# Patient Record
Sex: Female | Born: 1955
Health system: Southern US, Community
[De-identification: ages and names within clinical notes are randomized; demographics above are authoritative.]

## PROBLEM LIST (undated history)

## (undated) DIAGNOSIS — K649 Unspecified hemorrhoids: Secondary | ICD-10-CM

## (undated) DIAGNOSIS — K760 Fatty (change of) liver, not elsewhere classified: Secondary | ICD-10-CM

## (undated) DIAGNOSIS — M1990 Unspecified osteoarthritis, unspecified site: Secondary | ICD-10-CM

## (undated) DIAGNOSIS — I8390 Asymptomatic varicose veins of unspecified lower extremity: Secondary | ICD-10-CM

## (undated) DIAGNOSIS — D649 Anemia, unspecified: Secondary | ICD-10-CM

## (undated) DIAGNOSIS — K8020 Calculus of gallbladder without cholecystitis without obstruction: Secondary | ICD-10-CM

## (undated) DIAGNOSIS — F419 Anxiety disorder, unspecified: Secondary | ICD-10-CM

## (undated) DIAGNOSIS — C4491 Basal cell carcinoma of skin, unspecified: Secondary | ICD-10-CM

## (undated) DIAGNOSIS — F32A Depression, unspecified: Secondary | ICD-10-CM

## (undated) DIAGNOSIS — F329 Major depressive disorder, single episode, unspecified: Secondary | ICD-10-CM

## (undated) DIAGNOSIS — I10 Essential (primary) hypertension: Secondary | ICD-10-CM

## (undated) DIAGNOSIS — I739 Peripheral vascular disease, unspecified: Secondary | ICD-10-CM

## (undated) DIAGNOSIS — K5790 Diverticulosis of intestine, part unspecified, without perforation or abscess without bleeding: Secondary | ICD-10-CM

## (undated) DIAGNOSIS — K219 Gastro-esophageal reflux disease without esophagitis: Secondary | ICD-10-CM

## (undated) DIAGNOSIS — G709 Myoneural disorder, unspecified: Secondary | ICD-10-CM

## (undated) DIAGNOSIS — N83209 Unspecified ovarian cyst, unspecified side: Secondary | ICD-10-CM

## (undated) DIAGNOSIS — Z87898 Personal history of other specified conditions: Secondary | ICD-10-CM

## (undated) HISTORY — DX: Depression, unspecified: F32.A

## (undated) HISTORY — DX: Unspecified osteoarthritis, unspecified site: M19.90

## (undated) HISTORY — DX: Essential (primary) hypertension: I10

## (undated) HISTORY — DX: Basal cell carcinoma of skin, unspecified: C44.91

## (undated) HISTORY — PX: BARIATRIC SURGERY: SHX1103

## (undated) HISTORY — DX: Calculus of gallbladder without cholecystitis without obstruction: K80.20

## (undated) HISTORY — PX: CARPAL TUNNEL RELEASE: SHX101

## (undated) HISTORY — PX: BUNIONECTOMY: SHX129

## (undated) HISTORY — DX: Fatty (change of) liver, not elsewhere classified: K76.0

## (undated) HISTORY — DX: Major depressive disorder, single episode, unspecified: F32.9

## (undated) HISTORY — DX: Unspecified ovarian cyst, unspecified side: N83.209

## (undated) HISTORY — PX: VAGINAL HYSTERECTOMY: SUR661

## (undated) HISTORY — DX: Diverticulosis of intestine, part unspecified, without perforation or abscess without bleeding: K57.90

## (undated) HISTORY — DX: Unspecified hemorrhoids: K64.9

## (undated) HISTORY — DX: Asymptomatic varicose veins of unspecified lower extremity: I83.90

## (undated) HISTORY — PX: CYSTOCELE REPAIR: SHX163

## (undated) HISTORY — PX: CHOLECYSTECTOMY: SHX55

---

## 1999-03-21 ENCOUNTER — Ambulatory Visit (HOSPITAL_BASED_OUTPATIENT_CLINIC_OR_DEPARTMENT_OTHER): Admission: RE | Admit: 1999-03-21 | Discharge: 1999-03-21 | Payer: Self-pay | Admitting: Orthopedic Surgery

## 2009-02-21 ENCOUNTER — Ambulatory Visit (HOSPITAL_COMMUNITY): Admission: RE | Admit: 2009-02-21 | Discharge: 2009-02-21 | Payer: Self-pay | Admitting: Nurse Practitioner

## 2010-02-25 ENCOUNTER — Encounter: Admission: RE | Admit: 2010-02-25 | Discharge: 2010-02-25 | Payer: Self-pay | Admitting: Nurse Practitioner

## 2010-05-02 ENCOUNTER — Ambulatory Visit (HOSPITAL_BASED_OUTPATIENT_CLINIC_OR_DEPARTMENT_OTHER): Admission: RE | Admit: 2010-05-02 | Discharge: 2010-05-02 | Payer: Self-pay | Admitting: Orthopedic Surgery

## 2010-11-22 LAB — BASIC METABOLIC PANEL
BUN: 10 mg/dL (ref 6–23)
Creatinine, Ser: 0.53 mg/dL (ref 0.4–1.2)
Glucose, Bld: 73 mg/dL (ref 70–99)
Sodium: 133 mEq/L — ABNORMAL LOW (ref 135–145)

## 2010-11-22 LAB — POCT HEMOGLOBIN-HEMACUE: Hemoglobin: 11.2 g/dL — ABNORMAL LOW (ref 12.0–15.0)

## 2011-02-04 ENCOUNTER — Other Ambulatory Visit: Payer: Self-pay | Admitting: *Deleted

## 2011-02-04 DIAGNOSIS — Z1231 Encounter for screening mammogram for malignant neoplasm of breast: Secondary | ICD-10-CM

## 2011-03-07 ENCOUNTER — Other Ambulatory Visit: Payer: Self-pay | Admitting: Nurse Practitioner

## 2011-03-07 ENCOUNTER — Ambulatory Visit
Admission: RE | Admit: 2011-03-07 | Discharge: 2011-03-07 | Disposition: A | Discharge status: Home / Self Care | Payer: Commercial Managed Care - PPO | Source: Ambulatory Visit | Attending: *Deleted | Admitting: *Deleted

## 2011-03-07 DIAGNOSIS — Z1231 Encounter for screening mammogram for malignant neoplasm of breast: Secondary | ICD-10-CM

## 2011-04-02 ENCOUNTER — Telehealth: Payer: Self-pay | Admitting: *Deleted

## 2011-04-02 NOTE — Telephone Encounter (Signed)
Pt saw Dr Timothy Lasso for rectal bleeding and he suggested she come to see [place]. Hx gastric bypass, last COLON before that, 2008 she thinks. Pt to see Dr Christella Hartigan on 04/07/11 at 2pm.

## 2011-04-07 ENCOUNTER — Ambulatory Visit (INDEPENDENT_AMBULATORY_CARE_PROVIDER_SITE_OTHER): Payer: 59 | Admitting: Gastroenterology

## 2011-04-07 ENCOUNTER — Encounter: Payer: Self-pay | Admitting: Gastroenterology

## 2011-04-07 VITALS — BP 130/82 | HR 64 | Ht 64.0 in | Wt 190.0 lb

## 2011-04-07 DIAGNOSIS — K644 Residual hemorrhoidal skin tags: Secondary | ICD-10-CM

## 2011-04-07 DIAGNOSIS — K625 Hemorrhage of anus and rectum: Secondary | ICD-10-CM

## 2011-04-07 MED ORDER — PEG-KCL-NACL-NASULF-NA ASC-C 100 G PO SOLR: 1.0000 | ORAL | Status: DC

## 2011-04-07 NOTE — Progress Notes (Signed)
HPI: This is a    Has had 3 episodes of br blood in stool.  She saw her pcp last week.  She had intermittent constipation but not always associated with the bleeding.  She had colonoscopy in 2001, 2003, and June, 2008. The only significant finding has been diverticulosis and small internal hemorrhoids.  She has never been found to have polyps or cancers.  egd 2008 was normal.  Had fob testing earlier this year with gynecologist and it was negative.   Last 100 pounds since roux g bypass, gained 20 pounds since then.  Review of systems: Pertinent positive and negative review of systems were noted in the above HPI section.  All other review of systems was otherwise negative.   Past Medical History  Diagnosis Date  . Hypertension   . Asthma   . Diverticulosis   . Ovarian cyst   . Unspecified hemorrhoids with other complication   . Non-alcoholic fatty liver disease     Past Surgical History  Procedure Date  . Cystocele repair   . Abdominal hysterectomy   . Cholecystectomy      reports that she has never smoked. She does not have any smokeless tobacco history on file. She reports that she does not drink alcohol or use illicit drugs.  family history includes Heart disease in her other and Stroke in her other.  There is no history of Colon cancer.    Current Medications, Allergies were all reviewed with the patient via [facility] electronic medical record system.    Physical Exam: Ht 5\' 4"  (1.626 m)  Wt 190 lb (86.183 kg)  BMI 32.61 kg/m2 Constitutional: generally well-appearing Psychiatric: alert and oriented x3 Eyes: extraocular movements intact Mouth: oral pharynx moist, no lesions Neck: supple no lymphadenopathy Cardiovascular: heart regular rate and rhythm Lungs: clear to auscultation bilaterally Abdomen: soft, nontender, nondistended, no obvious ascites, no peritoneal signs, normal bowel sounds Extremities: no lower extremity edema bilaterally Skin: no  lesions on visible extremities Rectal exam with female assistant in room.  Small external anal hemorrhoids also possible small shallow posterior, midline anal fissure, No tenderness at that site   Assessment and plan: 55 y.o. female with recent lower gi bleeding, likely anorectal in origin  Colonoscopy at her soonest convenience.  Should not resume annual fecal blood testing for colon cancer screening for AT LEAST 5 years.

## 2011-04-07 NOTE — Patient Instructions (Addendum)
You will be set up for a colonoscopy (already scheduled) You should not get annual fobt testing for colon cancer screening for at least five years after colonoscopy. A copy of this information will be made available to Dr. Timothy Lasso.

## 2011-04-30 ENCOUNTER — Ambulatory Visit (AMBULATORY_SURGERY_CENTER): Payer: 59 | Admitting: Gastroenterology

## 2011-04-30 ENCOUNTER — Encounter: Payer: Self-pay | Admitting: Gastroenterology

## 2011-04-30 DIAGNOSIS — K644 Residual hemorrhoidal skin tags: Secondary | ICD-10-CM

## 2011-04-30 DIAGNOSIS — K5730 Diverticulosis of large intestine without perforation or abscess without bleeding: Secondary | ICD-10-CM

## 2011-04-30 DIAGNOSIS — K625 Hemorrhage of anus and rectum: Secondary | ICD-10-CM

## 2011-04-30 MED ORDER — SODIUM CHLORIDE 0.9 % IV SOLN: 500.0000 mL | INTRAVENOUS | Status: DC

## 2011-04-30 NOTE — Progress Notes (Signed)
Per pt she drank  both bottles of the movi prep last night.  Pt's prep was not that good on examination.  MAW  Pt tolerated the colon exam well. MAW

## 2011-04-30 NOTE — Patient Instructions (Signed)
FOLLOW DISCHARGE INSTRUCTIONS (BLUE & GREEN SHEETS)   INFORMATION GIVEN ON DIVERTICULOSIS& HEMORRHOIDS.

## 2011-05-01 ENCOUNTER — Telehealth: Payer: Self-pay | Admitting: *Deleted

## 2011-05-01 NOTE — Telephone Encounter (Signed)
Message left to call if necessary. 

## 2011-08-07 ENCOUNTER — Encounter: Payer: Self-pay | Admitting: Vascular Surgery

## 2011-08-08 ENCOUNTER — Ambulatory Visit (INDEPENDENT_AMBULATORY_CARE_PROVIDER_SITE_OTHER): Payer: 59 | Admitting: Vascular Surgery

## 2011-08-08 ENCOUNTER — Ambulatory Visit (INDEPENDENT_AMBULATORY_CARE_PROVIDER_SITE_OTHER): Payer: 59 | Admitting: *Deleted

## 2011-08-08 ENCOUNTER — Encounter: Payer: Self-pay | Admitting: Vascular Surgery

## 2011-08-08 ENCOUNTER — Other Ambulatory Visit: Payer: 59

## 2011-08-08 VITALS — BP 148/84 | HR 51 | Resp 18 | Ht 64.0 in | Wt 193.0 lb

## 2011-08-08 DIAGNOSIS — I83893 Varicose veins of bilateral lower extremities with other complications: Secondary | ICD-10-CM | POA: Insufficient documentation

## 2011-08-08 DIAGNOSIS — I872 Venous insufficiency (chronic) (peripheral): Secondary | ICD-10-CM

## 2011-08-08 NOTE — Progress Notes (Signed)
[facility]  Referred by:  Gwen Pounds, MD [phone] [facility] 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  Patient notes, onset of her varicosities years ago.  They have managed with thigh high compression stockings without signficant pain until recently.  The patient has had no history of DVT, known history of varicose vein, no history of venous stasis ulcers, no history of  Lymphedema and history of skin changes in lower legs.  There is no family history of venous disorders.  The patient has used compression stockings in the past.  The patient has had two children.  She also had a varicose vein mistakenly biopsied as possible melanoma.    Past Medical History  Diagnosis Date  . Hypertension   . Asthma   . Diverticulosis   . Ovarian cyst   . Unspecified hemorrhoids with other complication   . Non-alcoholic fatty liver disease   . Arthritis   . Migraine   . Basal cell carcinoma     x 5  . Depression   . Gallstones   . DM (diabetes mellitus)     Past Surgical History  Procedure Date  . Cystocele repair   . Vaginal hysterectomy   . Cholecystectomy   . Bariatric surgery   . Bunionectomy     bilateral    History   Social History  . Marital Status: Married    Spouse Name: N/A    Number of Children: 2  . Years of Education: N/A   Occupational History  . Registered Nurse   .  Why   Social History Main Topics  . Smoking status: Never Smoker   . Smokeless tobacco: Never Used  . Alcohol Use: No  . Drug Use: No  . Sexually Active: Not on file   Other Topics Concern  . Not on file   Social History Narrative  . No narrative on file    Family History  Problem Relation Age of Onset  . Colon cancer Neg Hx   .  Heart disease Father     grandmother,grandfather,uncle  . Stroke Maternal Grandmother     PGM  . Lung cancer Father     Current Outpatient Prescriptions on File Prior to Visit  Medication Sig Dispense Refill  . acetaminophen (TYLENOL ARTHRITIS PAIN) 650 MG CR tablet Take 650 mg by mouth 2 (two) times daily.        . AMBULATORY NON FORMULARY MEDICATION Take 1 tablet by mouth 3 (three) times daily. Bariatric vitamin       . AMBULATORY NON FORMULARY MEDICATION Take 3 tablets by mouth 2 (two) times daily. Bariatric Calcium       . AMBULATORY NON FORMULARY MEDICATION Take 1 tablet by mouth daily. Bariatric Iron       . amitriptyline (ELAVIL) 50 MG tablet Take 50 mg by mouth at bedtime.        Marland Kitchen aspirin 81 MG tablet Take 81 mg by mouth daily.        . bisoprolol-hydrochlorothiazide (ZIAC) 10-6.25 MG per tablet Take 1 tablet by mouth daily.        . Cholecalciferol (VITAMIN D3) 2000 UNITS TABS Take 1 tablet by mouth daily.        . citalopram (CELEXA) 20 MG tablet Take  20 mg by mouth daily. TAKING 40MG  DAILY      . docusate sodium (COLACE) 100 MG capsule Take 100 mg by mouth 2 (two) times daily.        Marland Kitchen doxazosin (CARDURA) 1 MG tablet Take 1 mg by mouth at bedtime.        Marland Kitchen esomeprazole (NEXIUM) 40 MG capsule Take 40 mg by mouth daily before breakfast.        . estradiol (EVAMIST) 1.53 MG/SPRAY transdermal spray Place 1 spray onto the skin daily.        Marland Kitchen ibuprofen (ADVIL,MOTRIN) 100 MG tablet Take 100 mg by mouth every 6 (six) hours as needed.        . traMADol (ULTRAM) 50 MG tablet Take 100 mg by mouth 3 (three) times daily.        Marland Kitchen zolpidem (AMBIEN) 5 MG tablet Take 5 mg by mouth at bedtime as needed.        . peg 3350 powder (MOVIPREP) 100 G SOLR Take 1 kit (100 g total) by mouth as directed. See written handout  1 kit  0    Allergies  Allergen Reactions  . Dilaudid (Hydromorphone Hcl) Itching    NARCOTICS cause itching      Review of Systems (Positive items checked otherwise  negative)  General: [ ]  Weight loss, [ ]  Weight gain, [ ]   Loss of appetite, [ ]  Fever  Neurologic: [ ]  Dizziness, [ ]  Blackouts, [x]  Headaches, [ ]  Seizure  Ear/Nose/Throat: [ ]  Change in eyesight, [ ]  Change in hearing, [ ]  Nose bleeds, [ ]  Sore throat  Vascular: [x]  Pain in legs with walking, [ ]  Pain in feet while lying flat, [ ]  Non-healing ulcer, Stroke, [ ]  "Mini stroke", [ ]  Slurred speech, [ ]  Temporary blindness, [ ]  Blood clot in vein, [ ]  Phlebitis  Pulmonary: [ ]  Home oxygen, [ ]  Productive cough, [ ]  Bronchitis, [ ]  Coughing up blood,  [ ]  Asthma, [ ]  Wheezing  Musculoskeletal: [x]  Arthritis, [x]  Joint pain, [ ]  Muscle pain  Cardiac: [ ]  Chest pain, [ ]  Chest tightness/pressure, [ ]  Shortness of breath when lying flat, [ ]  Shortness of breath with exertion, [ ]  Palpitations, [ ]  Heart murmur, [ ]  Arrythmia,  [ ]  Atrial fibrillation  Hematologic: [ ]  Bleeding problems, [ ]  Clotting disorder, [ ]  Anemia  Psychiatric:  [ ]  Depression, [ ]  Anxiety, [ ]  Attention deficit disorder  Gastrointestinal:  [ ]  Black stool,[ ]   Blood in stool, [ ]  Peptic ulcer disease, [ ]  Reflux, [ ]  Hiatal hernia, [ ]  Trouble swallowing, [ ]  Diarrhea, [ ]  Constipation  Urinary:  [ ]  Kidney disease, [ ]  Burning with urination, [ ]  Frequent urination, [ ]  Difficulty urinating  Skin: [ ]  Ulcers, [ ]  Rashes   Physical Examination  Filed Vitals:   08/08/11 1213  BP: 148/84  Pulse: 51  Resp: 18  Height: 5\' 4"  (1.626 m)  Weight: 193 lb (87.544 kg)   Body mass index is 33.13 kg/(m^2).  General: A&O x 3, WDWN, obese  Head: [place]/AT  Ear/Nose/Throat: Hearing grossly intact, nares w/o erythema or drainage, oropharynx w/o Erythema/Exudate  Eyes: PERRLA, EOMI  Neck: Supple, no nuchal rigidity, no palpable LAD  Pulmonary: Sym exp, good air movt, CTAB, no rales, rhonchi, & wheezing  Cardiac: RRR, Nl S1, S2, no Murmurs, rubs or gallops  Vascular: Vessel Right Left  Radial Palpable Palpable    Brachial Palpable Palpable  Carotid Palpable, without bruit Palpable, without bruit  Aorta Non-palpable N/A  Femoral Palpable Palpable  Popliteal Non-palpable Non-palpable  PT Palpable Palpable  DP Palpable Palpable   Gastrointestinal: soft, NTND, -G/R, - HSM, - masses, - CVAT B  Musculoskeletal: M/S 5/5 throughout , Extremities without ischemic changes , extensive varicosities on anterior bilateral legs (L>>R), Left leg also has posterior varicosities, some skin changes c/w CVI  Neurologic: CN 2-12 intact , Pain and light touch intact in extremities , Motor exam as listed above  Psychiatric: Judgment intact, Mood & affect appropriate for pt's clinical situation  Dermatologic: See M/S exam for extremity exam, no rashes otherwise noted  Lymph : No Cervical, Axillary, or Inguinal lymphadenopathy   Non-Invasive Vascular Imaging  BLE Venous Insufficiency Duplex (Date: 08/08/11):   RLE: GSV incompetent, mild deep reflux  LLE: GSV and LSV incompetent, mild deep reflux  Medical Decision Making  Felicia Burke is a 55 y.o. female who presents with: symptomatic chronic venous insufficiency (C4) not responsive to chronic compression therapy.   Based on the patient's history and examination, I recommend: continued compression stocking use (by report multiple years already) and evaluation by Vein Clinic for possible endovenous ablation of left GSV +/- stab phlebectomy.    The right leg to be addressed later on.  I will arrange for the follow evaluation by either Dr. Arbie Cookey or Dr. Hart Rochester.  Thank you for allowing [place] to participate in this patient's care.  Leonides Sake, MD [facility] Office: [phone] Pager: [phone]  08/08/2011, 6:28 PM

## 2011-08-13 NOTE — Procedures (Unsigned)
LOWER EXTREMITY VENOUS REFLUX EXAM  INDICATION:  Varicose veins  EXAM:  Using color-flow imaging and pulse Doppler spectral analysis, the bilateral common femoral, femoral, popliteal, posterior tibial, great and small saphenous veins were evaluated.  There is evidence suggesting deep venous insufficiency in the bilateral lower extremity.  The bilateral saphenofemoral junction is not competent with reflux of >500 milliseconds.  The bilateral GSV is not competent with reflux of >500 milliseconds in the proximal mid segment with the caliber as described below.   The left proximal small saphenous vein demonstrates incompetency with thick walls that appear post phlebitic.  The right small saphenous vein appears normal.  GSV Diameter (used if found to be incompetent only)                                           Right    Left Proximal Greater Saphenous Vein           0.74 cm  1.0 cm Proximal-to-mid-thigh                     0.58 cm  0.5 cm Mid thigh                                 0.18 cm  0.35 cm Mid-distal thigh                          cm       cm Distal thigh                              0.20 cm  0.23 cm Knee                                      NV cm    NV cm  IMPRESSION: 1. Bilateral great saphenous vein is not competent with reflux of >500     milliseconds. 2. The bilateral great saphenous vein is not tortuous however they     feed multiple varicose branches bilaterally. 3. The deep venous system is not competent with reflux of >500     milliseconds. 4. The left small saphenous vein is not competent with reflux of >500     milliseconds and appears post phlebitic.  ___________________________________________ Fransisco Hertz, MD  LT/MEDQ  D:  08/12/2011  T:  08/12/2011  Job:  [id number]

## 2011-11-11 ENCOUNTER — Ambulatory Visit: Payer: 59 | Admitting: Vascular Surgery

## 2011-11-17 ENCOUNTER — Encounter: Payer: Self-pay | Admitting: Vascular Surgery

## 2011-11-18 ENCOUNTER — Ambulatory Visit (INDEPENDENT_AMBULATORY_CARE_PROVIDER_SITE_OTHER): Payer: 59 | Admitting: Vascular Surgery

## 2011-11-18 ENCOUNTER — Encounter: Payer: Self-pay | Admitting: Vascular Surgery

## 2011-11-18 VITALS — BP 158/75 | HR 56 | Resp 18 | Ht 64.0 in | Wt 198.5 lb

## 2011-11-18 DIAGNOSIS — I83893 Varicose veins of bilateral lower extremities with other complications: Secondary | ICD-10-CM

## 2011-11-18 NOTE — Progress Notes (Signed)
Problems with Activities of Daily Living Secondary to Leg Pain  1. 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   2. Felicia Burke states cooking and cleaning activities that require prolonged standing are very difficult for her due to leg pain.  3. Felicia Burke states that traveling distances in an automobile is difficult due to leg pain.  Rankin, Neena Rhymes  Failure of  Conservative Therapy:  1. Worn 20-30 mm Hg thigh high compression hose >3 months with no relief of symptoms.  2. Frequently elevates legs-no relief of symptoms  3. Taken Ibuprofen 600 Mg TID with no relief of symptoms.  The patient has failed conservative treatment. She continues to have pain despite treatment. His exam reveals marked varicosities over both anterior thighs. I reimaged her veins with SonoSite and this does show large saphenous veins bilaterally. He continues to have leg pain with prolonged standing. I have recommended laser ablation of her great saphenous vein and stab phlebectomy of a large tributary varicosities. I explained this is a stage treatment. She feels that the right leg is slightly worse in her left the we will proceed with this at her earliest convenience. She understands this is an outpatient procedure and a slight risk of DVT.

## 2011-12-03 ENCOUNTER — Other Ambulatory Visit: Payer: Self-pay | Admitting: *Deleted

## 2011-12-03 DIAGNOSIS — I83893 Varicose veins of bilateral lower extremities with other complications: Secondary | ICD-10-CM

## 2011-12-10 ENCOUNTER — Other Ambulatory Visit: Payer: Self-pay | Admitting: *Deleted

## 2011-12-10 DIAGNOSIS — I83893 Varicose veins of bilateral lower extremities with other complications: Secondary | ICD-10-CM

## 2012-01-21 ENCOUNTER — Encounter: Payer: Self-pay | Admitting: Vascular Surgery

## 2012-01-22 ENCOUNTER — Other Ambulatory Visit: Payer: Self-pay | Admitting: *Deleted

## 2012-01-22 ENCOUNTER — Ambulatory Visit (INDEPENDENT_AMBULATORY_CARE_PROVIDER_SITE_OTHER): Payer: 59 | Admitting: Vascular Surgery

## 2012-01-22 ENCOUNTER — Encounter: Payer: Self-pay | Admitting: Vascular Surgery

## 2012-01-22 VITALS — BP 155/92 | HR 56 | Resp 18 | Ht 64.0 in | Wt 198.8 lb

## 2012-01-22 DIAGNOSIS — I83893 Varicose veins of bilateral lower extremities with other complications: Secondary | ICD-10-CM

## 2012-01-22 HISTORY — PX: ENDOVENOUS ABLATION SAPHENOUS VEIN W/ LASER: SUR449

## 2012-01-22 NOTE — Progress Notes (Signed)
Laser Ablation Procedure      Date: 01/22/2012    Felicia Burke DOB:04-Nov-1955  Consent signed: Yes  Surgeon:T.F. Linsie Lupo  Procedure: Laser Ablation: right Greater Saphenous Vein  BP 155/92  Pulse 56  Resp 18  Ht 5\' 4"  (1.626 m)  Wt 198 lb 12.8 oz (90.175 kg)  BMI 34.12 kg/m2  Start time: 8:30AM   End time: 10:20AM  Tumescent Anesthesia: 700 cc 0.9% NaCl with 50 cc Lidocaine HCL with 1% Epi and 15 cc 8.4% NaHCO3  Local Anesthesia: 5 cc Lidocaine HCL and NaHCO3 (ratio 2:1)  Continuous Mode: 15 Watts Total Energy 1784 Joules Total Time1:58      Stab Phlebectomy >20 Sites: Thigh and Calf   Right leg  Patient tolerated procedure well: Yes  Rankin, Neena Rhymes  Description of Procedure:  After marking the course of the saphenous vein and the secondary varicosities in the standing position, the patient was placed on the operating table in the supine position, and the right leg was prepped and draped in sterile fashion. Local anesthetic was administered, and under ultrasound guidance the saphenous vein was accessed with a micro needle and guide wire; then the micro puncture sheath was placed. A guide wire was inserted to the saphenofemoral junction, followed by a 5 french sheath.  The position of the sheath and then the laser fiber below the junction was confirmed using the ultrasound and visualization of the aiming beam.  Tumescent anesthesia was administered along the course of the saphenous vein using ultrasound guidance. Protective laser glasses were placed on the patient, and the laser was fired at at 15 watt continuous mode.  For a total of 1784 joules.  A steri strip was applied to the puncture site.  The patient was then put into Trendelenburg position.  Local anesthetic was utilized overlying the marked varicosities.  Greater than >20 stab wounds were made using the tip of an 11 blade; and using the vein hook,  The phlebectomies were performed using a hemostat to avulse these  varicosities.  Adequate hemostasis was achieved, and steri strips were applied to the stab wound.      ABD pads and thigh high compression stockings were applied.  Ace wrap bandages were applied over the phlebectomy sites and at the top of the saphenofemoral junction.  Blood loss was less than 15 cc.  The patient ambulated out of the operating room having tolerated the procedure well.

## 2012-01-23 ENCOUNTER — Encounter: Payer: Self-pay | Admitting: Vascular Surgery

## 2012-01-27 ENCOUNTER — Telehealth: Payer: Self-pay | Admitting: *Deleted

## 2012-01-27 NOTE — Telephone Encounter (Signed)
01/27/2012  Time: 11:00 AM   Patient Name: Felicia Burke  Patient of: T.F. Early  Procedure:Laser Ablation right greater saphenous vein and stab phlebectomy >20 incisions right leg 01-22-2012  Reached patient at home and checked  Her status  Yes    Comments/Actions Taken    Ms. Burrous states no problems with bleeding/oozing, pain, or swelling.  Reviewed post procedure instructions with patient and reminded her of post laser duplex and follow up appointment with Dr. Arbie Cookey on 01-29-2012. Ravindra Baranek, Neena Rhymes   @SIGNATURE @

## 2012-01-28 ENCOUNTER — Encounter: Payer: Self-pay | Admitting: Vascular Surgery

## 2012-01-28 ENCOUNTER — Other Ambulatory Visit (HOSPITAL_COMMUNITY): Payer: Self-pay | Admitting: Nurse Practitioner

## 2012-01-28 DIAGNOSIS — Z1231 Encounter for screening mammogram for malignant neoplasm of breast: Secondary | ICD-10-CM

## 2012-01-29 ENCOUNTER — Ambulatory Visit (INDEPENDENT_AMBULATORY_CARE_PROVIDER_SITE_OTHER): Payer: 59 | Admitting: Vascular Surgery

## 2012-01-29 ENCOUNTER — Encounter: Payer: Self-pay | Admitting: Vascular Surgery

## 2012-01-29 VITALS — BP 158/93 | HR 49 | Resp 18 | Ht 64.0 in | Wt 200.0 lb

## 2012-01-29 DIAGNOSIS — I83893 Varicose veins of bilateral lower extremities with other complications: Secondary | ICD-10-CM

## 2012-01-29 DIAGNOSIS — M7989 Other specified soft tissue disorders: Secondary | ICD-10-CM

## 2012-01-29 DIAGNOSIS — M79609 Pain in unspecified limb: Secondary | ICD-10-CM

## 2012-01-29 NOTE — Progress Notes (Signed)
R GSV EVLA post ablation venous duplex performed @ [facility] 01/29/2012

## 2012-01-29 NOTE — Progress Notes (Signed)
The patient presents today for followup of her right leg laser ablation of great saphenous vein and stab phlebectomy of greater than 20 tributary varicosities extensively over her anterior thigh and calf. She did very well with minimal discomfort. She has the typical amount of bruising associated with the phlebectomy. He has been compliant with her compression garment  Vascular lab study reveals no evidence of DVT. She does have a bleed ablation from the insertion site in the distal thigh up to the saphenofemoral junction  Impression and plan: Successful treatment of right leg great saphenous vein reflux and tributary varicosities. She will continue to wear compression for one additional week and this leg. She is scheduled for similar treatment in her left leg in approximately 3 weeks

## 2012-02-04 NOTE — Procedures (Unsigned)
DUPLEX DEEP VENOUS EXAM - LOWER EXTREMITY  INDICATION:  Varicose veins, follow up endovenous laser ablation.  HISTORY:  Edema:  Yes. Trauma/Surgery:  Right GSV endovenous laser ablation dated 01/26/2012. Pain:  Yes. PE:  No. Previous DVT:  No. Anticoagulants:  No. Other:  DUPLEX EXAM:               CFV   SFV   PopV  PTV    GSV               R  L  R  L  R  L  R   L  R  L Thrombosis    0     0     0     0      + Spontaneous   +     +     +     +      + Phasic        +     +     +     +      0 Augmentation  +     +     +     +      0 Compressible  +     +     +     +      0 Competent     +     +     0     +  Legend:  + - yes  o - no  p - partial  D - decreased  IMPRESSION: 1. No evidence of deep vein thrombosis identified involving the right     lower extremity, patent and compressible. 2. Good post-ablation results the length of the right great saphenous     vein ablated. 3. Deep venous Reflux present involving the right popliteal vein of     >522milliseconds.      _____________________________ Larina Earthly, M.D.  SH/MEDQ  D:  01/29/2012  T:  01/29/2012  Job:  [id number]

## 2012-02-19 ENCOUNTER — Encounter: Payer: Self-pay | Admitting: Vascular Surgery

## 2012-02-25 ENCOUNTER — Encounter: Payer: Self-pay | Admitting: Vascular Surgery

## 2012-02-26 ENCOUNTER — Encounter: Payer: Self-pay | Admitting: Vascular Surgery

## 2012-02-26 ENCOUNTER — Ambulatory Visit (INDEPENDENT_AMBULATORY_CARE_PROVIDER_SITE_OTHER): Payer: 59 | Admitting: Vascular Surgery

## 2012-02-26 VITALS — BP 154/105 | HR 45 | Resp 16 | Ht 64.0 in | Wt 200.0 lb

## 2012-02-26 DIAGNOSIS — I83893 Varicose veins of bilateral lower extremities with other complications: Secondary | ICD-10-CM

## 2012-02-26 HISTORY — PX: ENDOVENOUS ABLATION SAPHENOUS VEIN W/ LASER: SUR449

## 2012-02-26 NOTE — Progress Notes (Signed)
Laser Ablation Procedure      Date: 02/26/2012    Felicia Burke DOB:[birth year]  Consent signed: Yes  Surgeon:T.F. Seraya Jobst  Procedure: Laser Ablation: left Greater Saphenous Vein  BP 154/105  Pulse 45  Resp 16  Ht 5\' 4"  (1.626 m)  Wt 200 lb (90.719 kg)  BMI 34.33 kg/m2  Start time: 8:45am   End time: 10:15am  Tumescent Anesthesia: 750 cc 0.9% NaCl with 50 cc Lidocaine HCL with 1% Epi and 15 cc 8.4% NaHCO3  Local Anesthesia: 5 cc Lidocaine HCL and NaHCO3 (ratio 2:1)  Continuous Mode: 15 WATTS Total Energy 2153 jOULES Total Time2:23     Stab Phlebectomy: >20 Sites: Thigh and Calf  Patient tolerated procedure well: Yes   Description of Procedure:  After marking the course of the saphenous vein and the secondary varicosities in the standing position, the patient was placed on the operating table in the supine position, and the left leg was prepped and draped in sterile fashion. Local anesthetic was administered, and under ultrasound guidance the saphenous vein was accessed with a micro needle and guide wire; then the micro puncture sheath was placed. A guide wire was inserted to the saphenofemoral junction, followed by a 5 french sheath.  The position of the sheath and then the laser fiber below the junction was confirmed using the ultrasound and visualization of the aiming beam.  Tumescent anesthesia was administered along the course of the saphenous vein using ultrasound guidance. Protective laser glasses were placed on the patient, and the laser was fired at at 15 watt continuous mode.  For a total of 2153 joules.  A steri strip was applied to the puncture site.  The patient was then put into Trendelenburg position.  Local anesthetic was utilized overlying the marked varicosities.  Greater than >20 stab wounds were made using the tip of an 11 blade; and using the vein hook,  The phlebectomies were performed using a hemostat to avulse these varicosities.  Adequate hemostasis was  achieved, and steri strips were applied to the stab wound.      ABD pads and thigh high compression stockings were applied.  Ace wrap bandages were applied over the phlebectomy sites and at the top of the saphenofemoral junction.  Blood loss was less than 15 cc.  The patient ambulated out of the operating room having tolerated the procedure well.

## 2012-03-01 ENCOUNTER — Telehealth: Payer: Self-pay | Admitting: *Deleted

## 2012-03-01 ENCOUNTER — Encounter: Payer: Self-pay | Admitting: Vascular Surgery

## 2012-03-01 NOTE — Telephone Encounter (Signed)
03/01/2012  Time: 1:15 PM   Patient Name: Felicia Burke  Patient of: T.F. Early  Procedure:Laser Ablation left greater saphenous vein and stab phlebectomy >20 incisions left leg 02-26-2012  Reached patient at home and checked  Her status  Yes    Comments/Actions Taken: [name] states no problems with pain, swelling, or bleeding.  Reviewed post procedural instructions with [name] and reminded her of duplex and follow up appointment with Dr. Hart Rochester on 03-02-2012. Diannah Rindfleisch, Neena Rhymes    @SIGNATURE @

## 2012-03-02 ENCOUNTER — Encounter (INDEPENDENT_AMBULATORY_CARE_PROVIDER_SITE_OTHER): Payer: 59 | Admitting: *Deleted

## 2012-03-02 ENCOUNTER — Ambulatory Visit (INDEPENDENT_AMBULATORY_CARE_PROVIDER_SITE_OTHER): Payer: 59 | Admitting: Vascular Surgery

## 2012-03-02 ENCOUNTER — Encounter: Payer: Self-pay | Admitting: Vascular Surgery

## 2012-03-02 VITALS — BP 157/90 | HR 55 | Resp 20 | Ht 64.0 in | Wt 200.0 lb

## 2012-03-02 DIAGNOSIS — I83893 Varicose veins of bilateral lower extremities with other complications: Secondary | ICD-10-CM

## 2012-03-02 NOTE — Progress Notes (Signed)
Subjective:     Patient ID: Felicia Burke, female   DOB: [birth year], 56 y.o.   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 The patient tolerated the procedure well last week and is doing nicely. She has had some mild to moderate discomfort along the course of the great saphenous vein. She already notices less edema in both legs distally. She is wearing her long leg elastic impression stockings. She took ibuprofen as instructed and is elevating the legs.  Review of Systems     Objective:   Physical ExamBP 157/90  Pulse 55  Resp 20  Ht 5\' 4"  (1.626 m)  Wt 200 lb (90.719 kg)  BMI 34.33 kg/m2  General well-developed well-nourished female in no apparent distress alert and oriented x3 Left leg with Steri-Strips in place over stab phlebectomy sites. Mild to moderate tenderness along the course of great saphenous vein. 1+ edema distally. 3+ dorsalis pedis pulse palpable left foot.  Today I ordered a venous duplex exam of the left leg which are reviewed and interpreted. There is no DVT. There is total occlusion of great saphenous vein from the distal thigh to the saphenofemoral junction     Assessment:     Successful laser ablation left great saphenous vein with multiple stab phlebectomy for painful varicosities    Plan:     Return on when necessary basis to see Dr. early

## 2012-03-05 ENCOUNTER — Telehealth: Payer: Self-pay

## 2012-03-05 NOTE — Telephone Encounter (Signed)
Phone call today to f/u with discussion of symptoms on 6/27.  Pt. Stated that her pain was "a little less".  Stated that yesterday, her pain was noticeable with sitting/ increased pressure to area of left thigh.  Stated firm area has increased in size to length of index finger.  States the bruising has lightened-up a little.  Stated she continues to wear compression hose, iced the thigh last eve, and plans on taking it easy over weekend.  Appt. Given for 03/09/12 to see Dr. Arbie Cookey.  Agrees w/ plan.  Pt. Knows to go to the ER over weekend if sx's worsen.

## 2012-03-05 NOTE — Telephone Encounter (Signed)
Pt. called on 03/04/12 to report left upper thigh pain that started on 6/26.  Describes an area approx. 1" long and the width of little finger, that feels "firm".  Stated has significant bruising.  Denied any increased swelling or warmth.  Stated it is difficult to tell if there is any redness within the bruised area.  Voiced concern of this being very different than the last time she had laser tx.  Stated everything checked out fine when seen in f/u on Tues., 6/25, and noticed this new development on 6/26.  Discussed w/ Dr. Darrick Penna.  Advised pt. can schedule appt. to see Dr. Arbie Cookey next Tuesday.  Voice message left for pt. Re: scheduling appt. To see Dr. Arbie Cookey.

## 2012-03-08 ENCOUNTER — Ambulatory Visit (HOSPITAL_COMMUNITY): Payer: 59

## 2012-03-08 ENCOUNTER — Encounter: Payer: Self-pay | Admitting: Vascular Surgery

## 2012-03-09 ENCOUNTER — Ambulatory Visit (INDEPENDENT_AMBULATORY_CARE_PROVIDER_SITE_OTHER): Payer: 59 | Admitting: Vascular Surgery

## 2012-03-09 ENCOUNTER — Encounter: Payer: Self-pay | Admitting: Vascular Surgery

## 2012-03-09 VITALS — BP 179/71 | HR 69 | Resp 18 | Ht 64.0 in | Wt 200.0 lb

## 2012-03-09 DIAGNOSIS — I83893 Varicose veins of bilateral lower extremities with other complications: Secondary | ICD-10-CM

## 2012-03-09 NOTE — Progress Notes (Signed)
The patient has today for followup of her staged bilateral laser ablation and stab phlebectomy most curvature varicosities. She 3 standard left ablation and phlebectomy and was seen back with noninvasive vascular laboratories study on 03/02/2012 showing no evidence of DVT. Several days later she developed more bruising and swelling in her medial thigh at the level of phlebectomy incisions and is seen for further evaluation of this. She did have a visit with her OB/GYN physician who started her Keflex due to the erythema.  On physical exam she does have some hematoma under the skin and area of Phlebectomy very large varicosities in her proximal medial thigh. I do not see any evidence of infection. I did reimage her saphenous vein the SonoSite and this is occluded.  Pressure and plan: Combination of superficial thrombophlebitis and hematoma with some soreness associated with this. The patient was reassured that this will resolve spontaneously. She will continue her elevation and compression garments which she reports is giving her improved symptom relief. She is able to continue her activities as needed

## 2012-03-15 NOTE — Procedures (Unsigned)
DUPLEX DEEP VENOUS EXAM - LOWER EXTREMITY  INDICATION:  Varicose veins with other complication  HISTORY:  Edema:  No Trauma/Surgery:  Right great saphenous vein laser ablation on 01/22/2012, left great saphenous vein laser ablation on 02/26/2012 Pain:  No PE:  No Previous DVT:  No Anticoagulants: Other:  DUPLEX EXAM:               CFV   SFV   PopV  PTV    GSV               R  L  R  L  R  L  R   L  R  L Thrombosis    o  o     o     o      o     + Spontaneous   +  +     +     +      +     o Phasic        +  +     +     +      +     o Augmentation  +  +     +     +      +     o Compressible  +  +     +     +      +     o Competent     +  +     +     +      +     o  Legend:  + - yes  o - no  p - partial  D - decreased  IMPRESSION: 1. No evidence of deep vein thrombosis noted in the left lower     extremity. 2. The left great saphenous vein appears totally occluded from the     distal insertion site to near the saphenofemoral junction. 3. Reflux of >500 milliseconds noted in the left saphenofemoral     junction which extends into a medial branch.   _____________________________ Quita Skye. Hart Rochester, M.D.  CH/MEDQ  D:  03/05/2012  T:  03/05/2012  Job:  [id number]

## 2012-04-27 ENCOUNTER — Ambulatory Visit (HOSPITAL_COMMUNITY): Payer: 59

## 2012-04-30 ENCOUNTER — Ambulatory Visit (HOSPITAL_COMMUNITY)
Admission: RE | Admit: 2012-04-30 | Discharge: 2012-04-30 | Disposition: A | Discharge status: Home / Self Care | Payer: 59 | Source: Ambulatory Visit | Attending: Nurse Practitioner | Admitting: Nurse Practitioner

## 2012-04-30 DIAGNOSIS — Z1231 Encounter for screening mammogram for malignant neoplasm of breast: Secondary | ICD-10-CM | POA: Insufficient documentation

## 2013-03-28 ENCOUNTER — Other Ambulatory Visit (HOSPITAL_COMMUNITY): Payer: Self-pay | Admitting: Nurse Practitioner

## 2013-03-28 DIAGNOSIS — Z1231 Encounter for screening mammogram for malignant neoplasm of breast: Secondary | ICD-10-CM

## 2013-05-02 ENCOUNTER — Ambulatory Visit (HOSPITAL_COMMUNITY)
Admission: RE | Admit: 2013-05-02 | Discharge: 2013-05-02 | Disposition: A | Discharge status: Home / Self Care | Payer: 59 | Source: Ambulatory Visit | Attending: Nurse Practitioner | Admitting: Nurse Practitioner

## 2013-05-02 DIAGNOSIS — Z1231 Encounter for screening mammogram for malignant neoplasm of breast: Secondary | ICD-10-CM | POA: Insufficient documentation

## 2013-05-04 ENCOUNTER — Other Ambulatory Visit: Payer: Self-pay | Admitting: Nurse Practitioner

## 2013-05-04 DIAGNOSIS — R928 Other abnormal and inconclusive findings on diagnostic imaging of breast: Secondary | ICD-10-CM

## 2013-05-30 ENCOUNTER — Ambulatory Visit
Admission: RE | Admit: 2013-05-30 | Discharge: 2013-05-30 | Disposition: A | Discharge status: Home / Self Care | Payer: 59 | Source: Ambulatory Visit | Attending: Nurse Practitioner | Admitting: Nurse Practitioner

## 2013-05-30 DIAGNOSIS — R928 Other abnormal and inconclusive findings on diagnostic imaging of breast: Secondary | ICD-10-CM

## 2014-05-03 ENCOUNTER — Ambulatory Visit: Payer: 59 | Admitting: Cardiology

## 2014-05-03 ENCOUNTER — Other Ambulatory Visit (HOSPITAL_COMMUNITY): Payer: Self-pay | Admitting: *Deleted

## 2014-05-03 ENCOUNTER — Encounter (HOSPITAL_COMMUNITY): Payer: Self-pay | Admitting: Pharmacy Technician

## 2014-05-03 NOTE — H&P (Signed)
Felicia Burke is an 58 y.o. female.    Chief Complaint: left shoulder pain  HPI: Pt is a 58 y.o. female complaining of left shoulder pain since an injury recently. Pain had continually increased since the beginning. X-rays in the clinic show subscap tear and rotator cuff tear. Pt has tried various conservative treatments which have failed to alleviate their symptoms, including medications. Various options are discussed with the patient. Risks, benefits and expectations were discussed with the patient. Patient understand the risks, benefits and expectations and wishes to proceed with surgery.   PCP:  [name], MD  D/C Plans:  Home   PMH: Past Medical History  Diagnosis Date  . Hypertension   . Asthma   . Diverticulosis   . Ovarian cyst   . Unspecified hemorrhoids with other complication   . Non-alcoholic fatty liver disease   . Arthritis   . Migraine   . Basal cell carcinoma     x 5  . Depression   . Gallstones   . DM (diabetes mellitus)   . Varicose veins     PSH: Past Surgical History  Procedure Laterality Date  . Cystocele repair    . Vaginal hysterectomy    . Cholecystectomy    . Bariatric surgery    . Bunionectomy      bilateral  . Endovenous ablation saphenous vein w/ laser  01-22-2012    right greater saphenous vein and stab phlebectomy >20 incisions right leg    [name]  . Endovenous ablation saphenous vein w/ laser  02-26-2012    left greater saphenous vein and stab phlebectomy >20 left leg    Social History:  reports that she has never smoked. She has never used smokeless tobacco. She reports that she does not drink alcohol or use illicit drugs.  Allergies:  Allergies  Allergen Reactions  . Dilaudid [Hydromorphone Hcl] Itching    NARCOTICS cause itching     Medications: No current facility-administered medications for this encounter.   Current Outpatient Prescriptions  Medication Sig Dispense Refill  . acetaminophen (TYLENOL  ARTHRITIS PAIN) 650 MG CR tablet Take 650 mg by mouth 2 (two) times daily.        . AMBULATORY NON FORMULARY MEDICATION Take 1 tablet by mouth 3 (three) times daily. Bariatric vitamin       . AMBULATORY NON FORMULARY MEDICATION Take 3 tablets by mouth 2 (two) times daily. Bariatric Calcium       . AMBULATORY NON FORMULARY MEDICATION Take 1 tablet by mouth daily. Bariatric Iron       . amitriptyline (ELAVIL) 50 MG tablet Take 50 mg by mouth at bedtime.        Marland Kitchen aspirin 81 MG tablet Take 81 mg by mouth daily.        . bisoprolol-hydrochlorothiazide (ZIAC) 10-6.25 MG per tablet Take 1 tablet by mouth daily.        . cephALEXin (KEFLEX) 500 MG capsule Take 500 mg by mouth 3 (three) times daily.      . Cholecalciferol (VITAMIN D3) 2000 UNITS TABS Take 1 tablet by mouth daily.        . citalopram (CELEXA) 20 MG tablet Take 40 mg by mouth daily. TAKING 40MG DAILY      . docusate sodium (COLACE) 100 MG capsule Take 100 mg by mouth 2 (two) times daily.        Marland Kitchen doxazosin (CARDURA) 1 MG tablet Take 1 mg by mouth at bedtime.        Marland Kitchen  esomeprazole (NEXIUM) 40 MG capsule Take 40 mg by mouth daily before breakfast.        . estradiol (EVAMIST) 1.53 MG/SPRAY transdermal spray Place 1 spray onto the skin daily.        Marland Kitchen ibuprofen (ADVIL,MOTRIN) 100 MG tablet Take 100 mg by mouth every 6 (six) hours as needed.        . peg 3350 powder (MOVIPREP) 100 G SOLR Take 1 kit (100 g total) by mouth as directed. See written handout  1 kit  0  . traMADol (ULTRAM) 50 MG tablet Take 100 mg by mouth 3 (three) times daily.        Marland Kitchen zolpidem (AMBIEN) 5 MG tablet Take 5 mg by mouth at bedtime as needed.          No results found for this or any previous visit (from the past 48 hour(s)). No results found.  ROS: Pain with rom of the left upper extremity  Physical Exam:  Alert and oriented 58 y.o. female in no acute distress Cranial nerves 2-12 intact Cervical spine: full rom with no tenderness, nv intact distally Chest:  active breath sounds bilaterally, no wheeze rhonchi or rales Heart: regular rate and rhythm, no murmur Abd: non tender non distended with active bowel sounds Hip is stable with rom  Left shoulder with very limited rom and strength due to recent injury nv intact distally No rashes or edema  Assessment/Plan Assessment: left shoulder pain secondary to subscap and cuff tears  Plan: Patient will undergo a left shoulder scope and cuff repair by Dr. Veverly Fells at [facility]. Risks benefits and expectations were discussed with the patient. Patient understand risks, benefits and expectations and wishes to proceed.

## 2014-05-03 NOTE — Pre-Procedure Instructions (Signed)
Felicia Burke  05/03/2014   Your procedure is scheduled on:  Monday, May 08, 2014 at 12:30 PM.   Report to [facility] Entrance "A" Admitting Office at 10:30 AM.   Call this number if you have problems the morning of surgery: [phone]   Remember:   Do not eat food or drink liquids after midnight Sunday, 05/07/14.   Take these medicines the morning of surgery with A SIP OF WATER: acetaminophen (TYLENOL ARTHRITIS PAIN), bisoprolol-hydrochlorothiazide (ZIAC), buPROPion (WELLBUTRIN XL), citalopram (CELEXA), doxazosin (CARDURA), esomeprazole (NEXIUM), gabapentin (NEURONTIN), traMADol (ULTRAM), pantoprazole (PROTONIX)   Stop Aspirin and Vitamins as of today.   Do not wear jewelry, make-up or nail polish.  Do not wear lotions, powders, or perfumes. You may wear deodorant.  Do not shave 48 hours prior to surgery.   Do not bring valuables to the hospital.  [facility] is not responsible                  for any belongings or valuables.               Contacts, dentures or bridgework may not be worn into surgery.  Leave suitcase in the car. After surgery it may be brought to your room.  For patients admitted to the hospital, discharge time is determined by your                treatment team.               Patients discharged the day of surgery will not be allowed to drive home.      Please read over the following fact sheets that you were given: Pain Booklet, Coughing and Deep Breathing and Surgical Site Infection Prevention

## 2014-05-04 ENCOUNTER — Encounter (HOSPITAL_COMMUNITY): Payer: Self-pay

## 2014-05-04 ENCOUNTER — Encounter (HOSPITAL_COMMUNITY)
Admission: RE | Admit: 2014-05-04 | Discharge: 2014-05-04 | Disposition: A | Discharge status: Home / Self Care | Payer: 59 | Source: Ambulatory Visit | Attending: Anesthesiology | Admitting: Anesthesiology

## 2014-05-04 ENCOUNTER — Encounter (HOSPITAL_COMMUNITY)
Admission: RE | Admit: 2014-05-04 | Discharge: 2014-05-04 | Disposition: A | Discharge status: Home / Self Care | Payer: 59 | Source: Ambulatory Visit | Attending: Orthopedic Surgery | Admitting: Orthopedic Surgery

## 2014-05-04 DIAGNOSIS — M24419 Recurrent dislocation, unspecified shoulder: Secondary | ICD-10-CM | POA: Diagnosis present

## 2014-05-04 DIAGNOSIS — M719 Bursopathy, unspecified: Principal | ICD-10-CM | POA: Insufficient documentation

## 2014-05-04 DIAGNOSIS — M949 Disorder of cartilage, unspecified: Secondary | ICD-10-CM | POA: Diagnosis not present

## 2014-05-04 DIAGNOSIS — M67919 Unspecified disorder of synovium and tendon, unspecified shoulder: Secondary | ICD-10-CM | POA: Insufficient documentation

## 2014-05-04 DIAGNOSIS — Z01818 Encounter for other preprocedural examination: Secondary | ICD-10-CM | POA: Insufficient documentation

## 2014-05-04 DIAGNOSIS — M899 Disorder of bone, unspecified: Secondary | ICD-10-CM | POA: Diagnosis not present

## 2014-05-04 HISTORY — DX: Anemia, unspecified: D64.9

## 2014-05-04 HISTORY — DX: Gastro-esophageal reflux disease without esophagitis: K21.9

## 2014-05-04 HISTORY — DX: Peripheral vascular disease, unspecified: I73.9

## 2014-05-04 LAB — CBC
HCT: 32.7 % — ABNORMAL LOW (ref 36.0–46.0)
Hemoglobin: 10.6 g/dL — ABNORMAL LOW (ref 12.0–15.0)
MCH: 29 pg (ref 26.0–34.0)
MCHC: 32.4 g/dL (ref 30.0–36.0)
MCV: 89.6 fL (ref 78.0–100.0)
PLATELETS: 257 10*3/uL (ref 150–400)
RBC: 3.65 MIL/uL — AB (ref 3.87–5.11)
RDW: 15.5 % (ref 11.5–15.5)
WBC: 6.6 10*3/uL (ref 4.0–10.5)

## 2014-05-04 LAB — BASIC METABOLIC PANEL
ANION GAP: 14 (ref 5–15)
BUN: 11 mg/dL (ref 6–23)
CALCIUM: 9.2 mg/dL (ref 8.4–10.5)
CO2: 26 mEq/L (ref 19–32)
Chloride: 97 mEq/L (ref 96–112)
Creatinine, Ser: 0.53 mg/dL (ref 0.50–1.10)
GFR calc Af Amer: >90 mL/min (ref >90)
GLUCOSE: 91 mg/dL (ref 70–99)
POTASSIUM: 4.1 meq/L (ref 3.7–5.3)
SODIUM: 137 meq/L (ref 137–147)

## 2014-05-07 MED ORDER — CHLORHEXIDINE GLUCONATE 4 % EX LIQD
60.0000 mL | Freq: Once | CUTANEOUS | Status: DC
  Filled 2014-05-07: qty 60

## 2014-05-07 MED ORDER — CEFAZOLIN SODIUM-DEXTROSE 2-3 GM-% IV SOLR
2.0000 g | INTRAVENOUS | Status: AC
  Administered 2014-05-08: 2 g via INTRAVENOUS
  Filled 2014-05-07: qty 50

## 2014-05-08 ENCOUNTER — Encounter (HOSPITAL_COMMUNITY): Payer: Self-pay | Admitting: Anesthesiology

## 2014-05-08 ENCOUNTER — Encounter (HOSPITAL_COMMUNITY)
Admission: RE | Disposition: A | Discharge status: Home / Self Care | Payer: Self-pay | Source: Ambulatory Visit | Attending: Orthopedic Surgery

## 2014-05-08 ENCOUNTER — Encounter (HOSPITAL_COMMUNITY): Payer: 59 | Admitting: Anesthesiology

## 2014-05-08 ENCOUNTER — Ambulatory Visit (HOSPITAL_COMMUNITY)
Admission: RE | Admit: 2014-05-08 | Discharge: 2014-05-08 | Disposition: A | Discharge status: Home / Self Care | Payer: 59 | Source: Ambulatory Visit | Attending: Orthopedic Surgery | Admitting: Orthopedic Surgery

## 2014-05-08 ENCOUNTER — Ambulatory Visit (HOSPITAL_COMMUNITY): Payer: 59 | Admitting: Anesthesiology

## 2014-05-08 DIAGNOSIS — X58XXXA Exposure to other specified factors, initial encounter: Secondary | ICD-10-CM | POA: Diagnosis not present

## 2014-05-08 DIAGNOSIS — Z79899 Other long term (current) drug therapy: Secondary | ICD-10-CM | POA: Diagnosis not present

## 2014-05-08 DIAGNOSIS — Z885 Allergy status to narcotic agent status: Secondary | ICD-10-CM | POA: Insufficient documentation

## 2014-05-08 DIAGNOSIS — Z90710 Acquired absence of both cervix and uterus: Secondary | ICD-10-CM | POA: Insufficient documentation

## 2014-05-08 DIAGNOSIS — M19019 Primary osteoarthritis, unspecified shoulder: Secondary | ICD-10-CM | POA: Insufficient documentation

## 2014-05-08 DIAGNOSIS — Z85828 Personal history of other malignant neoplasm of skin: Secondary | ICD-10-CM | POA: Diagnosis not present

## 2014-05-08 DIAGNOSIS — F329 Major depressive disorder, single episode, unspecified: Secondary | ICD-10-CM | POA: Insufficient documentation

## 2014-05-08 DIAGNOSIS — K7689 Other specified diseases of liver: Secondary | ICD-10-CM | POA: Diagnosis not present

## 2014-05-08 DIAGNOSIS — S43006A Unspecified dislocation of unspecified shoulder joint, initial encounter: Secondary | ICD-10-CM | POA: Diagnosis present

## 2014-05-08 DIAGNOSIS — E119 Type 2 diabetes mellitus without complications: Secondary | ICD-10-CM | POA: Insufficient documentation

## 2014-05-08 DIAGNOSIS — F3289 Other specified depressive episodes: Secondary | ICD-10-CM | POA: Insufficient documentation

## 2014-05-08 DIAGNOSIS — M129 Arthropathy, unspecified: Secondary | ICD-10-CM | POA: Diagnosis not present

## 2014-05-08 DIAGNOSIS — S43429A Sprain of unspecified rotator cuff capsule, initial encounter: Secondary | ICD-10-CM | POA: Insufficient documentation

## 2014-05-08 DIAGNOSIS — Z7982 Long term (current) use of aspirin: Secondary | ICD-10-CM | POA: Insufficient documentation

## 2014-05-08 DIAGNOSIS — M25819 Other specified joint disorders, unspecified shoulder: Secondary | ICD-10-CM | POA: Diagnosis not present

## 2014-05-08 DIAGNOSIS — I10 Essential (primary) hypertension: Secondary | ICD-10-CM | POA: Diagnosis not present

## 2014-05-08 DIAGNOSIS — Z9089 Acquired absence of other organs: Secondary | ICD-10-CM | POA: Insufficient documentation

## 2014-05-08 DIAGNOSIS — J45909 Unspecified asthma, uncomplicated: Secondary | ICD-10-CM | POA: Diagnosis not present

## 2014-05-08 DIAGNOSIS — M7580 Other shoulder lesions, unspecified shoulder: Secondary | ICD-10-CM

## 2014-05-08 HISTORY — PX: SHOULDER ARTHROSCOPY WITH SUBACROMIAL DECOMPRESSION, ROTATOR CUFF REPAIR AND BICEP TENDON REPAIR: SHX5687

## 2014-05-08 LAB — GLUCOSE, CAPILLARY
Glucose-Capillary: 100 mg/dL — ABNORMAL HIGH (ref 70–99)
Glucose-Capillary: 114 mg/dL — ABNORMAL HIGH (ref 70–99)

## 2014-05-08 SURGERY — SHOULDER ARTHROSCOPY WITH SUBACROMIAL DECOMPRESSION, ROTATOR CUFF REPAIR AND BICEP TENDON REPAIR
Anesthesia: General | Site: Shoulder | Laterality: Left

## 2014-05-08 MED ORDER — PROPOFOL 10 MG/ML IV BOLUS
INTRAVENOUS | Status: AC
  Filled 2014-05-08: qty 20

## 2014-05-08 MED ORDER — OXYCODONE-ACETAMINOPHEN 5-325 MG PO TABS: 1.0000 | ORAL_TABLET | ORAL | Status: DC | PRN

## 2014-05-08 MED ORDER — FENTANYL CITRATE 0.05 MG/ML IJ SOLN
INTRAMUSCULAR | Status: AC
  Filled 2014-05-08: qty 5

## 2014-05-08 MED ORDER — MIDAZOLAM HCL 2 MG/2ML IJ SOLN
INTRAMUSCULAR | Status: AC
  Administered 2014-05-08: 2 mg via INTRAVENOUS
  Filled 2014-05-08: qty 2

## 2014-05-08 MED ORDER — ROCURONIUM BROMIDE 50 MG/5ML IV SOLN
INTRAVENOUS | Status: AC
  Filled 2014-05-08: qty 1

## 2014-05-08 MED ORDER — GLYCOPYRROLATE 0.2 MG/ML IJ SOLN
INTRAMUSCULAR | Status: AC
  Filled 2014-05-08: qty 2

## 2014-05-08 MED ORDER — FENTANYL CITRATE 0.05 MG/ML IJ SOLN
50.0000 ug | Freq: Once | INTRAMUSCULAR | Status: AC
  Administered 2014-05-08: 50 ug via INTRAVENOUS

## 2014-05-08 MED ORDER — NEOSTIGMINE METHYLSULFATE 10 MG/10ML IV SOLN
INTRAVENOUS | Status: DC | PRN
  Administered 2014-05-08: 3 mg via INTRAVENOUS

## 2014-05-08 MED ORDER — DEXAMETHASONE SODIUM PHOSPHATE 4 MG/ML IJ SOLN
INTRAMUSCULAR | Status: DC | PRN
  Administered 2014-05-08: 4 mg via INTRAVENOUS

## 2014-05-08 MED ORDER — MIDAZOLAM HCL 2 MG/2ML IJ SOLN
1.0000 mg | Freq: Once | INTRAMUSCULAR | Status: AC
  Administered 2014-05-08: 2 mg via INTRAVENOUS

## 2014-05-08 MED ORDER — SODIUM CHLORIDE 0.9 % IV SOLN
10.0000 mg | INTRAVENOUS | Status: DC | PRN
  Administered 2014-05-08: 20 ug/min via INTRAVENOUS

## 2014-05-08 MED ORDER — ONDANSETRON HCL 4 MG/2ML IJ SOLN
INTRAMUSCULAR | Status: DC | PRN
  Administered 2014-05-08: 4 mg via INTRAVENOUS

## 2014-05-08 MED ORDER — BUPIVACAINE-EPINEPHRINE (PF) 0.25% -1:200000 IJ SOLN
INTRAMUSCULAR | Status: AC
  Filled 2014-05-08: qty 30

## 2014-05-08 MED ORDER — NEOSTIGMINE METHYLSULFATE 10 MG/10ML IV SOLN
INTRAVENOUS | Status: AC
  Filled 2014-05-08: qty 1

## 2014-05-08 MED ORDER — DEXAMETHASONE SODIUM PHOSPHATE 4 MG/ML IJ SOLN
INTRAMUSCULAR | Status: AC
  Filled 2014-05-08: qty 1

## 2014-05-08 MED ORDER — MIDAZOLAM HCL 2 MG/2ML IJ SOLN
INTRAMUSCULAR | Status: AC
  Filled 2014-05-08: qty 2

## 2014-05-08 MED ORDER — MIDAZOLAM HCL 5 MG/ML IJ SOLN: 2.0000 mg | Freq: Once | INTRAMUSCULAR | Status: DC

## 2014-05-08 MED ORDER — METHOCARBAMOL 500 MG PO TABS: 500.0000 mg | ORAL_TABLET | Freq: Three times a day (TID) | ORAL | Status: DC | PRN

## 2014-05-08 MED ORDER — LIDOCAINE HCL (CARDIAC) 20 MG/ML IV SOLN
INTRAVENOUS | Status: DC | PRN
  Administered 2014-05-08: 60 mg via INTRAVENOUS

## 2014-05-08 MED ORDER — BUPIVACAINE-EPINEPHRINE (PF) 0.5% -1:200000 IJ SOLN
INTRAMUSCULAR | Status: DC | PRN
  Administered 2014-05-08: 30 mL via PERINEURAL

## 2014-05-08 MED ORDER — FENTANYL CITRATE 0.05 MG/ML IJ SOLN
INTRAMUSCULAR | Status: AC
  Administered 2014-05-08: 50 ug via INTRAVENOUS
  Filled 2014-05-08: qty 2

## 2014-05-08 MED ORDER — SODIUM CHLORIDE 0.9 % IR SOLN
Status: DC | PRN
  Administered 2014-05-08 (×2): 3000 mL

## 2014-05-08 MED ORDER — LIDOCAINE HCL (CARDIAC) 20 MG/ML IV SOLN
INTRAVENOUS | Status: AC
  Filled 2014-05-08: qty 5

## 2014-05-08 MED ORDER — LACTATED RINGERS IV SOLN
INTRAVENOUS | Status: DC
  Administered 2014-05-08: 11:00:00 via INTRAVENOUS

## 2014-05-08 MED ORDER — SUCCINYLCHOLINE CHLORIDE 20 MG/ML IJ SOLN
INTRAMUSCULAR | Status: AC
  Filled 2014-05-08: qty 1

## 2014-05-08 MED ORDER — ROCURONIUM BROMIDE 100 MG/10ML IV SOLN
INTRAVENOUS | Status: DC | PRN
  Administered 2014-05-08: 50 mg via INTRAVENOUS

## 2014-05-08 MED ORDER — EPHEDRINE SULFATE 50 MG/ML IJ SOLN
INTRAMUSCULAR | Status: DC | PRN
  Administered 2014-05-08: 7.5 mg via INTRAVENOUS

## 2014-05-08 MED ORDER — BUPIVACAINE-EPINEPHRINE 0.25% -1:200000 IJ SOLN
INTRAMUSCULAR | Status: DC | PRN
  Administered 2014-05-08: 10 mL

## 2014-05-08 MED ORDER — GLYCOPYRROLATE 0.2 MG/ML IJ SOLN
INTRAMUSCULAR | Status: DC | PRN
  Administered 2014-05-08: 0.2 mg via INTRAVENOUS
  Administered 2014-05-08: 0.4 mg via INTRAVENOUS
  Administered 2014-05-08: 0.2 mg via INTRAVENOUS

## 2014-05-08 MED ORDER — PROPOFOL 10 MG/ML IV BOLUS
INTRAVENOUS | Status: DC | PRN
  Administered 2014-05-08: 150 mg via INTRAVENOUS

## 2014-05-08 SURGICAL SUPPLY — 70 items
ANCHOR ALL- SUT RC 2 SUT Y-K (Anchor) ×3 IMPLANT
ANCHOR ALL-SUT FLEX 1.3 Y-KNOT (Anchor) ×4 IMPLANT
ANCHOR ALL-SUT RC 2 SUT Y-K (Anchor) ×3 IMPLANT
BIT DRILL 1.3M DISPOSABLE (BIT) ×2 IMPLANT
BLADE LONG MED 31X9 (MISCELLANEOUS) IMPLANT
BLADE SURG 11 STRL SS (BLADE) ×2 IMPLANT
BUR OVAL 4.0 (BURR) ×2 IMPLANT
COVER SURGICAL LIGHT HANDLE (MISCELLANEOUS) ×2 IMPLANT
DRAPE INCISE IOBAN 66X45 STRL (DRAPES) ×2 IMPLANT
DRAPE STERI 35X30 U-POUCH (DRAPES) ×2 IMPLANT
DRAPE U-SHAPE 47X51 STRL (DRAPES) ×2 IMPLANT
DRSG EMULSION OIL 3X3 NADH (GAUZE/BANDAGES/DRESSINGS) ×4 IMPLANT
DRSG PAD ABDOMINAL 8X10 ST (GAUZE/BANDAGES/DRESSINGS) ×4 IMPLANT
DURAPREP 26ML APPLICATOR (WOUND CARE) ×2 IMPLANT
ELECT BLADE 4.0 EZ CLEAN MEGAD (MISCELLANEOUS) ×2
ELECT NEEDLE TIP 2.8 STRL (NEEDLE) ×2 IMPLANT
ELECT REM PT RETURN 9FT ADLT (ELECTROSURGICAL) ×2
ELECTRODE BLDE 4.0 EZ CLN MEGD (MISCELLANEOUS) ×1 IMPLANT
ELECTRODE REM PT RTRN 9FT ADLT (ELECTROSURGICAL) ×1 IMPLANT
GAUZE SPONGE 4X4 12PLY STRL (GAUZE/BANDAGES/DRESSINGS) IMPLANT
GLOVE BIO SURGEON STRL SZ 6.5 (GLOVE) ×2 IMPLANT
GLOVE BIOGEL PI IND STRL 6.5 (GLOVE) ×2 IMPLANT
GLOVE BIOGEL PI INDICATOR 6.5 (GLOVE) ×2
GLOVE BIOGEL PI ORTHO PRO 7.5 (GLOVE) ×1
GLOVE BIOGEL PI ORTHO PRO SZ8 (GLOVE) ×1
GLOVE ORTHO TXT STRL SZ7.5 (GLOVE) ×2 IMPLANT
GLOVE PI ORTHO PRO STRL 7.5 (GLOVE) ×1 IMPLANT
GLOVE PI ORTHO PRO STRL SZ8 (GLOVE) ×1 IMPLANT
GLOVE SURG ORTHO 8.5 STRL (GLOVE) ×2 IMPLANT
GOWN STRL REUS W/ TWL LRG LVL3 (GOWN DISPOSABLE) ×1 IMPLANT
GOWN STRL REUS W/ TWL XL LVL3 (GOWN DISPOSABLE) ×2 IMPLANT
GOWN STRL REUS W/TWL LRG LVL3 (GOWN DISPOSABLE) ×1
GOWN STRL REUS W/TWL XL LVL3 (GOWN DISPOSABLE) ×2
KIT BASIN OR (CUSTOM PROCEDURE TRAY) ×2 IMPLANT
KIT ROOM TURNOVER OR (KITS) ×2 IMPLANT
MANIFOLD NEPTUNE II (INSTRUMENTS) ×4 IMPLANT
NDL SUT 6 .5 CRC .975X.05 MAYO (NEEDLE) ×1 IMPLANT
NEEDLE 1/2 CIR MAYO (NEEDLE) ×2 IMPLANT
NEEDLE HYPO 25GX1X1/2 BEV (NEEDLE) ×2 IMPLANT
NEEDLE MAYO TAPER (NEEDLE) ×1
NEEDLE SPNL 18GX3.5 QUINCKE PK (NEEDLE) ×2 IMPLANT
NS IRRIG 1000ML POUR BTL (IV SOLUTION) ×2 IMPLANT
PACK SHOULDER (CUSTOM PROCEDURE TRAY) ×2 IMPLANT
PAD ARMBOARD 7.5X6 YLW CONV (MISCELLANEOUS) ×4 IMPLANT
RESECTOR FULL RADIUS 4.2MM (BLADE) ×2 IMPLANT
SET ARTHROSCOPY TUBING (MISCELLANEOUS) ×1
SET ARTHROSCOPY TUBING LN (MISCELLANEOUS) ×1 IMPLANT
SLING ARM FOAM STRAP LRG (SOFTGOODS) ×2 IMPLANT
SLING ARM MED ADULT FOAM STRAP (SOFTGOODS) IMPLANT
SPONGE GAUZE 4X4 12PLY STER LF (GAUZE/BANDAGES/DRESSINGS) ×2 IMPLANT
SPONGE LAP 4X18 X RAY DECT (DISPOSABLE) ×2 IMPLANT
STRIP CLOSURE SKIN 1/2X4 (GAUZE/BANDAGES/DRESSINGS) ×2 IMPLANT
SUCTION FRAZIER TIP 10 FR DISP (SUCTIONS) ×2 IMPLANT
SUT BONE WAX W31G (SUTURE) IMPLANT
SUT FIBERWIRE #2 38 T-5 BLUE (SUTURE) ×2
SUT MNCRL AB 4-0 PS2 18 (SUTURE) ×2 IMPLANT
SUT SILK 2 0 TIES 10X30 (SUTURE) ×2 IMPLANT
SUT VIC AB 0 CT1 27 (SUTURE) ×1
SUT VIC AB 0 CT1 27XBRD ANBCTR (SUTURE) ×1 IMPLANT
SUT VIC AB 0 CT2 27 (SUTURE) ×2 IMPLANT
SUT VIC AB 2-0 CT1 27 (SUTURE) ×1
SUT VIC AB 2-0 CT1 TAPERPNT 27 (SUTURE) ×1 IMPLANT
SUTURE FIBERWR #2 38 T-5 BLUE (SUTURE) ×1 IMPLANT
SYR CONTROL 10ML LL (SYRINGE) ×2 IMPLANT
TAPE CLOTH SURG 4X10 WHT LF (GAUZE/BANDAGES/DRESSINGS) ×2 IMPLANT
TOWEL OR 17X24 6PK STRL BLUE (TOWEL DISPOSABLE) ×2 IMPLANT
TOWEL OR 17X26 10 PK STRL BLUE (TOWEL DISPOSABLE) ×2 IMPLANT
TUBE CONNECTING 12X1/4 (SUCTIONS) ×2 IMPLANT
WAND HAND CNTRL MULTIVAC 90 (MISCELLANEOUS) ×2 IMPLANT
WATER STERILE IRR 1000ML POUR (IV SOLUTION) ×2 IMPLANT

## 2014-05-08 NOTE — Anesthesia Procedure Notes (Addendum)
Anesthesia Regional Block:  Interscalene brachial plexus block  Pre-Anesthetic Checklist: ,, timeout performed, Correct Patient, Correct Site, Correct Laterality, Correct Procedure, Correct Position, site marked, Risks and benefits discussed, pre-op evaluation,  At surgeon's request and post-op pain management  Laterality: Upper and Left  Prep: chloraprep and alcohol swabs       Needles:  Injection technique: Single-shot  Needle Type: Echogenic Needle     Needle Length: 9cm 9 cm Needle Gauge: 22 and 22 G  Needle insertion depth: 4 cm   Additional Needles:  Procedures: ultrasound guided (picture in chart) and nerve stimulator Interscalene brachial plexus block  Nerve Stimulator or Paresthesia:  Response: Twitch elicited, 0.5 mA, 0.3 ms,   Additional Responses:   Narrative:  Start time: 05/08/2014 11:35 AM End time: 05/08/2014 11:50 AM Injection made incrementally with aspirations every 5 mL.  Performed by: Personally  Anesthesiologist: Bartolo Darter, MD  Additional Notes: Block assessed prior to start of surgery.Tolreated well

## 2014-05-08 NOTE — Brief Op Note (Signed)
05/08/2014  3:32 PM  PATIENT:  Felicia Burke  58 y.o. 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, LARGE GLENOID OSSEOUS BANKART LESION   POST-OPERATIVE DIAGNOSIS:  LEFT SHOULDER ROTATOR CUFF TEAR RECURRENT DISLOCATION, LARGE GLENOID OSSEOUS BANKHART LESION   PROCEDURE:  Procedure(s): LEFT SHOULDER ARTHROSCOPY WITH SUBACROMIAL DECOMPRESSION,OPEN ROTATOR CUFF REPAIR  OPEN BICEP TENODESIS,  OPEN SUBSCAPULAR REPAIR (Left) OPEN BONY BANKART REPAIR, OPEN ROTATOR CUFF REPAIR  SURGEON:  Surgeon(s) and Role:    * [name], MD - Primary  PHYSICIAN ASSISTANT:   ASSISTANTS: Ventura Bruns, PA-C   ANESTHESIA:   regional and general  EBL:  Total I/O In: -  Out: 100 [Blood:100]  BLOOD ADMINISTERED:none  DRAINS: none   LOCAL MEDICATIONS USED:  MARCAINE     SPECIMEN:  No Specimen  DISPOSITION OF SPECIMEN:  N/A  COUNTS:  YES  TOURNIQUET:  * No tourniquets in log *  DICTATION: .Other Dictation: Dictation Number [id number]  PLAN OF CARE: Discharge to home after PACU  PATIENT DISPOSITION:  PACU - hemodynamically stable.   Delay start of Pharmacological VTE agent (>24hrs) due to surgical blood loss or risk of bleeding: not applicable

## 2014-05-08 NOTE — Anesthesia Postprocedure Evaluation (Signed)
  Anesthesia Post-op Note  Patient: Felicia Burke  Procedure(s) Performed: Procedure(s): LEFT SHOULDER ARTHROSCOPY WITH SUBACROMIAL DECOMPRESSION,OPEN ROTATOR CUFF REPAIR  OPEN BICEP TENODESIS,  OPEN SUBSCAPULAR REPAIR (Left)  Patient Location: PACU  Anesthesia Type:General and GA combined with regional for post-op pain  Level of Consciousness: awake and alert   Airway and Oxygen Therapy: Patient Spontanous Breathing  Post-op Pain: none  Post-op Assessment: Post-op Vital signs reviewed and Patient's Cardiovascular Status Stable  Post-op Vital Signs: stable  Last Vitals:  Filed Vitals:   05/08/14 1622  BP: 155/81  Pulse: 58  Temp:   Resp: 12    Complications: No apparent anesthesia complications

## 2014-05-08 NOTE — Discharge Instructions (Signed)
Ice to the shoulder at all times.  Keep a pillow propped behind the elbow to keep the arm across the waist, in front of you.  Do gentle exercises with the arm out of the sling once every hour for five minutes. Keep arm supported.  Must stay in sling if not doing exercises  Keep the incision covered and clean and dry for one week, then ok to get wet in the shower  Follow up with Dr Veverly Fells in two weeks in the office [phone]   What to eat:  For your first meals, you should eat lightly; only small meals initially.  If you do not have nausea, you may eat larger meals.  Avoid spicy, greasy and heavy food.    General Anesthesia, Adult, Care After  Refer to this sheet in the next few weeks. These instructions provide you with information on caring for yourself after your procedure. Your health care provider may also give you more specific instructions. Your treatment has been planned according to current medical practices, but problems sometimes occur. Call your health care provider if you have any problems or questions after your procedure.  WHAT TO EXPECT AFTER THE PROCEDURE  After the procedure, it is typical to experience:  Sleepiness.  Nausea and vomiting. HOME CARE INSTRUCTIONS  For the first 24 hours after general anesthesia:  Have a responsible person with you.  Do not drive a car. If you are alone, do not take public transportation.  Do not drink alcohol.  Do not take medicine that has not been prescribed by your health care provider.  Do not sign important papers or make important decisions.  You may resume a normal diet and activities as directed by your health care provider.  Change bandages (dressings) as directed.  If you have questions or problems that seem related to general anesthesia, call the hospital and ask for the anesthetist or anesthesiologist on call. SEEK MEDICAL CARE IF:  You have nausea and vomiting that continue the day after anesthesia.  You develop a  rash. SEEK IMMEDIATE MEDICAL CARE IF:  You have difficulty breathing.  You have chest pain.  You have any allergic problems. Document Released: 12/01/2000 Document Revised: 04/27/2013 Document Reviewed: 03/10/2013  [facility] Patient Information 2014 [place], [place].

## 2014-05-08 NOTE — Anesthesia Preprocedure Evaluation (Addendum)
Anesthesia Evaluation  Patient identified by MRN, date of birth, ID band Patient awake    Reviewed: Allergy & Precautions, H&P , NPO status , Patient's Chart, lab work & pertinent test results, reviewed documented beta blocker date and time   Airway       Dental   Pulmonary asthma ,  breath sounds clear to auscultation        Cardiovascular hypertension, Rhythm:Regular Rate:Normal     Neuro/Psych  Headaches,    GI/Hepatic GERD-  ,  Endo/Other  diabetes, Well Controlled, Type 2Morbid obesity  Renal/GU      Musculoskeletal   Abdominal (+) + obese,   Peds  Hematology  (+) anemia ,   Anesthesia Other Findings   Reproductive/Obstetrics                          Anesthesia Physical Anesthesia Plan  ASA: III  Anesthesia Plan: General   Post-op Pain Management:    Induction: Intravenous  Airway Management Planned: Oral ETT  Additional Equipment:   Intra-op Plan:   Post-operative Plan: Extubation in OR  Informed Consent: I have reviewed the patients History and Physical, chart, labs and discussed the procedure including the risks, benefits and alternatives for the proposed anesthesia with the patient or authorized representative who has indicated his/her understanding and acceptance.   Dental advisory given  Plan Discussed with: CRNA and Surgeon  Anesthesia Plan Comments:         Anesthesia Quick Evaluation

## 2014-05-08 NOTE — Interval H&P Note (Signed)
History and Physical Interval Note:  05/08/2014 12:08 PM  Felicia Burke  has presented today for surgery, with the diagnosis of LEFT SHOULDER ROTATOR CUFF TEAR CURRENT DISLOCATION   The various methods of treatment have been discussed with the patient and family. After consideration of risks, benefits and other options for treatment, the patient has consented to  Procedure(s): LEFT SHOULDER ARTHROSCOPY WITH SUBACROMIAL DECOMPRESSION,OPEN ROTATOR CUFF REPAIR  OPEN BICEP TENODESIS,  OPEN SUBSCAPULAR REPAIR (Left) as a surgical intervention .  The patient's history has been reviewed, patient examined, no change in status, stable for surgery.  I have reviewed the patient's chart and labs.  Questions were answered to the patient's satisfaction.     Nyeema Want,STEVEN R

## 2014-05-08 NOTE — Transfer of Care (Signed)
Immediate Anesthesia Transfer of Care Note  Patient: Felicia Burke  Procedure(s) Performed: Procedure(s): LEFT SHOULDER ARTHROSCOPY WITH SUBACROMIAL DECOMPRESSION,OPEN ROTATOR CUFF REPAIR  OPEN BICEP TENODESIS,  OPEN SUBSCAPULAR REPAIR (Left)  Patient Location: PACU  Anesthesia Type:General  Level of Consciousness: awake, alert  and oriented  Airway & Oxygen Therapy: Patient Spontanous Breathing and Patient connected to nasal cannula oxygen  Post-op Assessment: Report given to PACU RN and Post -op Vital signs reviewed and stable  Post vital signs: Reviewed and stable  Complications: No apparent anesthesia complications

## 2014-05-09 ENCOUNTER — Encounter (HOSPITAL_COMMUNITY): Payer: Self-pay | Admitting: Orthopedic Surgery

## 2014-05-09 NOTE — Op Note (Signed)
Felicia, Burke NO.:  [id number]  MEDICAL RECORD NO.:  [id number]  LOCATION:  [facility]                         FACILITY:  [facility]  PHYSICIAN:  Doran Heater. Veverly Fells, M.D. DATE OF BIRTH:  July 11, 1956  DATE OF PROCEDURE:  05/08/2014 DATE OF DISCHARGE:  05/08/2014                              OPERATIVE REPORT   PREOPERATIVE DIAGNOSIS:  Left shoulder dislocation with resulting rotator cuff tear.  Large bony Bankart lesion with redislocation.  POSTOPERATIVE DIAGNOSES:  Left shoulder dislocation with resulting rotator cuff tear.  Large bony Bankart lesion with redislocation. 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  PROCEDURE PERFORMED:  Left shoulder examined under anesthesia, shoulder arthroscopy with extensive intra-articular debridement of torn superior labrum anterior to posterior, arthroscopic biceps tenotomy, arthroscopic subacromial decompression with distal clavicle coplaning as well as open rotator cuff repair, open repair of large bony Bankart lesion, and subscapularis repair.  ATTENDING SURGEON:  Doran Heater. Veverly Fells, M.D.  ASSISTANT:  Abbott Pao. Dixon, PA-C, who was scrubbed the entire procedure and necessary for satisfactory completion of surgery.  ANESTHESIA:  General anesthesia was used plus interscalene block.  ESTIMATED BLOOD LOSS:  Minimal.  FLUID REPLACEMENT:  1200 mL crystalloids.  INSTRUMENT COUNTS:  Correct.  COMPLICATIONS:  There were no complications.  ANTIBIOTICS:  Perioperative antibiotics were given.  INDICATIONS:  The patient is a 58 year old female with worsening left shoulder pain secondary to dislocation.  She suffered a rotator cuff tear as well as a large bony Bankart lesion, partial-thickness subscapular tear, she also has advanced AC arthritis with outlet impingement for her rotator cuff.  The patient continues to report instability in her shoulder where __________ goes out, and she is in disabling amount of pain.  We  discussed options for management given the large amount of the glenoid that is involved about one-third of the width of the glenoid.  We discussed with the patient that she would likely suffer a continued instability unless we went ahead and repaired that bony defect.  We discussed that as well as a rotator cuff tear that we would attempt to repair that as well at the same time, and she did have evidence of superior labral tear and we did address that at the same time.  Risks and benefits of surgery discussed given ongoing instability and pain, she elected to proceed with surgery.  The risks and benefits were discussed.  Informed consent obtained.  DESCRIPTION OF PROCEDURE:  After adequate level of anesthesia achieved, the patient was positioned in modified beach-chair position.  Left shoulder correctly identified and examined under anesthesia.  Patient clearly had basically chronically subacutely subluxed state, she just continued to stay out.  I could reduce her back posteriorly and she would relocate but as soon as she took pressure off the front of her shoulder, she did go back out again.  We went ahead and sterilely prepped and draped the shoulder and arm.  Time-out was called.  We entered the shoulder in standard portals including anterior, posterior, and lateral portals for the arthroscope.  Identified severe tearing the superior labrum and the proximal biceps.  We released the biceps tendon arthroscopically and I debrided the damaged superior labrum.  There was terrible damage inside the shoulder including areas of full-thickness cartilage loss, both on the glenoid and the humeral head.  Numerous loose bodies were removed.  We saw the large bony defect in the front third of the glenoid.  Subscapularis did have some partial-thickness tearing, but it seemed to be largely intact.  The rotator cuff had a full-thickness supraspinatus tear.  It appeared to be basically a split- type  tear that did not appear to be significant retraction.  We reversed the scope looking posteriorly.  Posterior labrum was also damaged.  We just smoothed that down as well as there was some pretty significant delaminating of the cartilage on the posterior aspect of the humeral head, possibly near where the patient had the Hill-Sachs lesion.  We smoothed that down with a motorized shaver.  At this point, we placed the scope in the subacromial space.  We did a bursectomy and then an acromioplasty using a butcher block technique with a high-speed bur creating a type 1 acromial shape.  We did preserve the CA ligament, so to prevent any anterior or superior escape, and then we went ahead and identified the significant Allen impingement from the distal clavicle which was at least 0.5 cm below the acromion, so we went ahead and did a coplaning with the high-speed bur and got that nicely smoothed so that the rotator cuff not having any impingement on it.  Once we had done that subacromial decompression, we then made a deltopectoral incision started at the coracoid process extending down to the anterior humerus, dissection down through subcutaneous tissues using the Bovie. Identified the cephalic vein and took it initially laterally with the deltoid.  It did become injured and we had ligated during the surgery, but we retracted up to where we could see the rotator cuff tear.  We cleaned up the rotator cuff footprint with a rongeur.  We repaired with a single Y-Knot RC anchor, double loaded with #2 Hi-Fi, we also did side- to-side with #2 Hi-Fi and used #2 Hi-Fi down through bone out the lateral humerus, repaired both the medial and lateral portions of the footprint with a solid repair of that.  We then addressed the glenoid defect in the subscapular tear.  We completed the release of the subscapularis tendon which was actually in pretty good shape.  We placed #2 Hi-Fi suture in a modified  Mason-Allen suture technique in the free- end of the tendon to repair it.  We then went ahead and placed a __________ retractor, subluxed the humerus posteriorly, did some capsular release to be able to see down to the area where the inferior capsule and the large bony Bankart was located.  We were able to mobilize that entire piece of bone up with the inferior capsule back into the position.  We first repaired the bone there, scraped that and then we took the [facility].  With the [facility], scraped that bone to prepare it for healing.  We then placed 3 anchors in the bone right near the cartilage face, first was Y-knot Flex anchor, single load with #2 Hi-Fi suture and then 2 Y-knot RC anchors, both again spaced evenly along that large defect and then we took the suture __________ #2 Hi-Fi and brought that around the bony piece and sutured that into position and tied those sutures.  We had nice secure fixation. Recreated the big speed bump on the anterior part of the glenoid, felt good about just __________ a nice bumper  there and was it definitely bone-on-bone contact, so hopefully that will heal bone to bone as was anatomic as we could get it, we then repaired the subscapularis anatomically back to bone through drill holes, also with an anchor and otherwise not flexed inferiorly and then up to the interval and did rotator interval.  We did rotator interval closure with #2 Hi-Fi, we did multiple of those.  At that point, we had a nice solid repair, we examined the shoulder, we could get it about 20 degrees of external rotation, and then I was able to examine the patient under anesthesia and felt like the shoulder through a full arc of motion was stable and was not dislocating again. Then, we did the deltopectoral repair with #1 Vicryl interrupted, 2-0 Vicryl subcutaneous closure, and 4-0 Monocryl for skin and portals. Steri-Strips applied followed by sterile dressing and  shoulder sling. The patient tolerated the procedure well.     Doran Heater. Veverly Fells, M.D.     SRN/MEDQ  D:  05/08/2014  T:  05/09/2014  Job:  [id number]

## 2014-05-11 ENCOUNTER — Ambulatory Visit: Payer: 59 | Attending: Orthopedic Surgery

## 2014-05-11 DIAGNOSIS — IMO0001 Reserved for inherently not codable concepts without codable children: Secondary | ICD-10-CM | POA: Diagnosis not present

## 2014-05-11 DIAGNOSIS — M25519 Pain in unspecified shoulder: Secondary | ICD-10-CM | POA: Diagnosis not present

## 2014-05-11 DIAGNOSIS — R293 Abnormal posture: Secondary | ICD-10-CM | POA: Insufficient documentation

## 2014-05-11 DIAGNOSIS — M25619 Stiffness of unspecified shoulder, not elsewhere classified: Secondary | ICD-10-CM | POA: Diagnosis not present

## 2014-05-11 DIAGNOSIS — M6281 Muscle weakness (generalized): Secondary | ICD-10-CM | POA: Insufficient documentation

## 2014-05-23 ENCOUNTER — Ambulatory Visit: Payer: 59

## 2014-05-23 DIAGNOSIS — IMO0001 Reserved for inherently not codable concepts without codable children: Secondary | ICD-10-CM | POA: Diagnosis not present

## 2014-05-26 ENCOUNTER — Ambulatory Visit: Payer: 59

## 2014-05-26 DIAGNOSIS — IMO0001 Reserved for inherently not codable concepts without codable children: Secondary | ICD-10-CM | POA: Diagnosis not present

## 2014-05-30 ENCOUNTER — Ambulatory Visit: Payer: 59

## 2014-05-30 DIAGNOSIS — IMO0001 Reserved for inherently not codable concepts without codable children: Secondary | ICD-10-CM | POA: Diagnosis not present

## 2014-06-01 ENCOUNTER — Ambulatory Visit: Payer: 59

## 2014-06-01 DIAGNOSIS — IMO0001 Reserved for inherently not codable concepts without codable children: Secondary | ICD-10-CM | POA: Diagnosis not present

## 2014-06-05 ENCOUNTER — Ambulatory Visit: Payer: 59

## 2014-06-05 DIAGNOSIS — IMO0001 Reserved for inherently not codable concepts without codable children: Secondary | ICD-10-CM | POA: Diagnosis not present

## 2014-06-08 ENCOUNTER — Ambulatory Visit: Payer: 59 | Attending: Orthopedic Surgery

## 2014-06-08 DIAGNOSIS — M25612 Stiffness of left shoulder, not elsewhere classified: Secondary | ICD-10-CM | POA: Insufficient documentation

## 2014-06-08 DIAGNOSIS — M25512 Pain in left shoulder: Secondary | ICD-10-CM | POA: Diagnosis not present

## 2014-06-08 DIAGNOSIS — R293 Abnormal posture: Secondary | ICD-10-CM | POA: Insufficient documentation

## 2014-06-08 DIAGNOSIS — M6281 Muscle weakness (generalized): Secondary | ICD-10-CM | POA: Insufficient documentation

## 2014-06-08 DIAGNOSIS — Z5189 Encounter for other specified aftercare: Secondary | ICD-10-CM | POA: Diagnosis not present

## 2014-06-13 ENCOUNTER — Ambulatory Visit: Payer: 59

## 2014-06-13 DIAGNOSIS — Z5189 Encounter for other specified aftercare: Secondary | ICD-10-CM | POA: Diagnosis not present

## 2014-06-16 ENCOUNTER — Ambulatory Visit: Payer: 59

## 2014-06-19 ENCOUNTER — Ambulatory Visit: Payer: 59

## 2014-06-19 DIAGNOSIS — Z5189 Encounter for other specified aftercare: Secondary | ICD-10-CM | POA: Diagnosis not present

## 2014-06-22 ENCOUNTER — Ambulatory Visit: Payer: 59

## 2014-06-22 DIAGNOSIS — Z5189 Encounter for other specified aftercare: Secondary | ICD-10-CM | POA: Diagnosis not present

## 2014-06-26 ENCOUNTER — Ambulatory Visit: Payer: 59

## 2014-06-26 DIAGNOSIS — Z5189 Encounter for other specified aftercare: Secondary | ICD-10-CM | POA: Diagnosis not present

## 2014-06-29 ENCOUNTER — Ambulatory Visit: Payer: 59

## 2014-06-29 DIAGNOSIS — Z5189 Encounter for other specified aftercare: Secondary | ICD-10-CM | POA: Diagnosis not present

## 2014-07-04 ENCOUNTER — Ambulatory Visit: Payer: 59

## 2014-07-05 ENCOUNTER — Other Ambulatory Visit: Payer: Self-pay

## 2014-07-05 DIAGNOSIS — Z1231 Encounter for screening mammogram for malignant neoplasm of breast: Secondary | ICD-10-CM

## 2014-07-07 ENCOUNTER — Ambulatory Visit: Payer: 59

## 2014-07-07 DIAGNOSIS — Z5189 Encounter for other specified aftercare: Secondary | ICD-10-CM | POA: Diagnosis not present

## 2014-07-10 ENCOUNTER — Ambulatory Visit: Payer: 59 | Attending: Orthopedic Surgery

## 2014-07-10 DIAGNOSIS — R293 Abnormal posture: Secondary | ICD-10-CM | POA: Insufficient documentation

## 2014-07-10 DIAGNOSIS — M25612 Stiffness of left shoulder, not elsewhere classified: Secondary | ICD-10-CM | POA: Insufficient documentation

## 2014-07-10 DIAGNOSIS — Z5189 Encounter for other specified aftercare: Secondary | ICD-10-CM | POA: Insufficient documentation

## 2014-07-10 DIAGNOSIS — M25512 Pain in left shoulder: Secondary | ICD-10-CM | POA: Insufficient documentation

## 2014-07-10 DIAGNOSIS — M6281 Muscle weakness (generalized): Secondary | ICD-10-CM | POA: Insufficient documentation

## 2014-07-11 ENCOUNTER — Other Ambulatory Visit: Payer: Self-pay | Admitting: Obstetrics and Gynecology

## 2014-07-11 DIAGNOSIS — Z780 Asymptomatic menopausal state: Secondary | ICD-10-CM

## 2014-07-17 ENCOUNTER — Ambulatory Visit: Payer: 59

## 2014-07-17 DIAGNOSIS — R293 Abnormal posture: Secondary | ICD-10-CM | POA: Diagnosis not present

## 2014-07-17 DIAGNOSIS — M25612 Stiffness of left shoulder, not elsewhere classified: Secondary | ICD-10-CM | POA: Diagnosis not present

## 2014-07-17 DIAGNOSIS — M6281 Muscle weakness (generalized): Secondary | ICD-10-CM | POA: Diagnosis not present

## 2014-07-17 DIAGNOSIS — Z5189 Encounter for other specified aftercare: Secondary | ICD-10-CM | POA: Diagnosis present

## 2014-07-17 DIAGNOSIS — R29898 Other symptoms and signs involving the musculoskeletal system: Secondary | ICD-10-CM

## 2014-07-17 DIAGNOSIS — M25512 Pain in left shoulder: Secondary | ICD-10-CM | POA: Diagnosis not present

## 2014-07-17 NOTE — Therapy (Signed)
Physical Therapy Treatment  Patient Details  Name: Felicia Burke MRN: [id number] Date of Birth: May 26, 1956  Encounter Date: 07/17/2014      PT End of Session - 07/17/14 0756    Visit Number 16   Number of Visits 24   PT Start Time 0700   PT Stop Time 0750   PT Time Calculation (min) 50 min   Activity Tolerance Patient tolerated treatment well      Past Medical History  Diagnosis Date  . Hypertension   . Asthma   . Diverticulosis   . Ovarian cyst   . Unspecified hemorrhoids with other complication   . Non-alcoholic fatty liver disease   . Arthritis   . Migraine   . Depression   . Gallstones   . Varicose veins   . Peripheral vascular disease   . DM (diabetes mellitus)     prior to Gastric bypass  . GERD (gastroesophageal reflux disease)   . Basal cell carcinoma     x 5  . Anemia     Past Surgical History  Procedure Laterality Date  . Cystocele repair    . Vaginal hysterectomy    . Cholecystectomy    . Bariatric surgery    . Bunionectomy      bilateral Left side redo  . Endovenous ablation saphenous vein w/ laser  01-22-2012    right greater saphenous vein and stab phlebectomy >20 incisions right leg    [name]  . Endovenous ablation saphenous vein w/ laser  02-26-2012    left greater saphenous vein and stab phlebectomy >20 left leg  . Carpal tunnel release Bilateral   . Shoulder arthroscopy with subacromial decompression, rotator cuff repair and bicep tendon repair Left 05/08/2014    Procedure: LEFT SHOULDER ARTHROSCOPY WITH SUBACROMIAL DECOMPRESSION,OPEN ROTATOR CUFF REPAIR  OPEN BICEP TENODESIS,  OPEN SUBSCAPULAR REPAIR;  Surgeon: [name], MD;  Location: [place];  Service: Orthopedics;  Laterality: Left;    There were no vitals taken for this visit.  Visit Diagnosis:  Pain in joint, shoulder region, left  Stiffness of glenohumeral joint, left  Weakness of left upper extremity        [facility] PT Assessment - 07/17/14 0727    AROM   Left  Shoulder Flexion 135 Degrees   Left Shoulder ABduction --  118 degrees taken supine   Left Shoulder Internal Rotation --  60 degrees taken supine   Left Shoulder External Rotation --  40 degrees taken supine   Left Shoulder Horizontal ABduction --  115 degrees taken supine   Left Shoulder Horizontal ADduction --  105 degrees taken supine   PROM   Left Shoulder Flexion 143 Degrees   Left Shoulder ABduction 140 Degrees   Left Shoulder Internal Rotation 65 Degrees   Left Shoulder External Rotation 70 Degrees          [facility] Adult PT Treatment/Exercise - 07/17/14 0707    Exercises   Exercises --   Shoulder Exercises: Supine   Protraction Strengthening;15 reps;Weights  3 pounds with upper arm support and verbal cues to maintain    Other Supine Exercises --  triceps with upper arm support x15 3 pounds.   Shoulder Exercises: Seated   Other Seated Exercises --  presssure down into orage ball with circles RT and LT x15   Shoulder Exercises: Standing   Theraband Level (Shoulder Extension) Level 2 (Red)  with emphasis on scapular retraction and depressionn.   Other Standing Exercises --  wall ladder x15 to Level 25   Other Standing Exercises --  sitting press into orage ball with circles RT and LT. x15   Shoulder Exercises: Pulleys   Flexion 3 minutes   Manual Therapy   Manual Therapy Joint mobilization  passive range all planes to tolerance. Gr. 3 inferior glides          Education - 07/17/14 0755    Education provided No          PT Short Term Goals - 07/17/14 0759    PT SHORT TERM GOAL #1   Title Independent initial HEP   Status Achieved   PT SHORT TERM GOAL #2   Title Passive flexion to 100 degrees   Status Achieved   PT SHORT TERM GOAL #3   Title Passive abduction to 9 degrees   PT SHORT TERM GOAL #4   Status Achieved          PT Long Term Goals - 07/17/14 0801    PT LONG TERM GOAL #1   Title Demonstrrate understanding of use of cold for pain    Status Achieved   PT LONG TERM GOAL #2   Title Independent with advanced HEP   Time 8   Period Weeks   Status On-going   PT LONG TERM GOAL #3   Title Pain decreased 75% with normla activity at work and home   Time 8   Period Weeks   Status On-going   PT LONG TERM GOAL #4   Title Independent selfcare   Time 8   Period Weeks   Status Achieved   PT LONG TERM GOAL #5   Title return to light to moderate activity at home with minmal pain   Time 8   Period Weeks   Status On-going   Additional Long Term Goals   Additional Long Term Goals Yes          Plan - 07/17/14 0757    Clinical Impression Statement She had some minor soreness post but declined cold pack at end   Pt will benefit from skilled therapeutic intervention in order to improve on the following deficits Pain;Decreased range of motion;Decreased strength   Rehab Potential Good   PT Frequency 2x / week   PT Duration 8 weeks   PT Treatment/Interventions Passive range of motion;Manual techniques;Therapeutic exercise;Patient/family education   PT Next Visit Plan Progress per Dr Veverly Fells orders   PT Home Exercise Plan Progress  per MD orders   Consulted and Agree with Plan of Care Patient        Problem List Patient Active Problem List   Diagnosis Date Noted  . Varicose veins of lower extremities with other complications 16/06/9603  . Chronic venous insufficiency 08/08/2011                                            Darrel Hoover 07/17/2014, 8:07 AM

## 2014-07-17 NOTE — Patient Instructions (Signed)
Pt was asked to continue current exercise and activity adn discuss with Dr Veverly Fells later this week any limitations to activity.

## 2014-07-21 ENCOUNTER — Ambulatory Visit: Payer: 59

## 2014-07-21 DIAGNOSIS — M25612 Stiffness of left shoulder, not elsewhere classified: Secondary | ICD-10-CM

## 2014-07-21 DIAGNOSIS — M25512 Pain in left shoulder: Secondary | ICD-10-CM

## 2014-07-21 DIAGNOSIS — Z5189 Encounter for other specified aftercare: Secondary | ICD-10-CM | POA: Diagnosis not present

## 2014-07-21 DIAGNOSIS — R29898 Other symptoms and signs involving the musculoskeletal system: Secondary | ICD-10-CM

## 2014-07-21 NOTE — Patient Instructions (Signed)
Ms Dymek was advised to ice 2-3x today and tonight and tomorrow if needed. She wa sasked to not increase activity if more sore this weekend and if she did not respond negatively will add to HEP next week

## 2014-07-21 NOTE — Therapy (Signed)
Physical Therapy Treatment  Patient Details  Name: Felicia Burke MRN: [id number] Date of Birth: March 12, 1956  Encounter Date: 07/21/2014      PT End of Session - 07/21/14 0741    Visit Number 17   Number of Visits 30   PT Start Time 0658   PT Stop Time 0743   PT Time Calculation (min) 45 min   Activity Tolerance Patient tolerated treatment well   Behavior During Therapy [facility] for tasks assessed/performed      Past Medical History  Diagnosis Date  . Hypertension   . Asthma   . Diverticulosis   . Ovarian cyst   . Unspecified hemorrhoids with other complication   . Non-alcoholic fatty liver disease   . Arthritis   . Migraine   . Depression   . Gallstones   . Varicose veins   . Peripheral vascular disease   . DM (diabetes mellitus)     prior to Gastric bypass  . GERD (gastroesophageal reflux disease)   . Basal cell carcinoma     x 5  . Anemia     Past Surgical History  Procedure Laterality Date  . Cystocele repair    . Vaginal hysterectomy    . Cholecystectomy    . Bariatric surgery    . Bunionectomy      bilateral Left side redo  . Endovenous ablation saphenous vein w/ laser  01-22-2012    right greater saphenous vein and stab phlebectomy >20 incisions right leg    [name]  . Endovenous ablation saphenous vein w/ laser  02-26-2012    left greater saphenous vein and stab phlebectomy >20 left leg  . Carpal tunnel release Bilateral   . Shoulder arthroscopy with subacromial decompression, rotator cuff repair and bicep tendon repair Left 05/08/2014    Procedure: LEFT SHOULDER ARTHROSCOPY WITH SUBACROMIAL DECOMPRESSION,OPEN ROTATOR CUFF REPAIR  OPEN BICEP TENODESIS,  OPEN SUBSCAPULAR REPAIR;  Surgeon: [name], MD;  Location: [place];  Service: Orthopedics;  Laterality: Left;    There were no vitals taken for this visit.  Visit Diagnosis:  Pain in joint, shoulder region, left  Stiffness of glenohumeral joint, left  Weakness of left upper extremity      Subjective Assessment - 07/21/14 0706    Symptoms More sore this week from working  and exercise   Currently in Pain? Yes   Pain Score 4    Pain Location Shoulder   Pain Orientation Left   Pain Descriptors / Indicators Aching;Sharp   Pain Type Surgical pain   Pain Onset More than a month ago   Pain Frequency Constant   Aggravating Factors  using arm   Pain Relieving Factors cold, medication   Effect of Pain on Daily Activities limiting work and home tasks   Multiple Pain Sites No  She has some chronic pain issues in knees            [facility] Adult PT Treatment/Exercise - 07/21/14 0713    Shoulder Exercises: Seated   Theraband Level (Shoulder Extension) Level 1 (Yellow)  x15   Flexion Left;AROM;AAROM;20 reps   Abduction AROM;AAROM;20 reps  elbow at 90 degrees   Other Seated Exercises ball circles x30 clock and counter clock   Shoulder Exercises: Sidelying   External Rotation Left;15 reps;AROM  2 pounds x5  1 pound 2 x10   ABduction AROM;12 reps  1 pound   Shoulder Exercises: Standing   Protraction 12 reps;AROM  4 pounds   Horizontal ABduction AROM;12  reps  1 pound   Flexion 12 reps;Left   Shoulder Flexion Weight (lbs) 1   Other Standing Exercises wall ladder x12    Shoulder Exercises: Pulleys   Flexion --  5 minutes          PT Education - 07/21/14 0741    Education provided Yes   Education Details activity limits this weekend   Person(s) Educated Patient   Methods Explanation   Comprehension Verbalized understanding              Plan - 07/21/14 0743    Clinical Impression Statement Ms Gragert tolerated increased activity with addition of weight without increaed pain   Pt will benefit from skilled therapeutic intervention in order to improve on the following deficits Impaired flexibility;Pain;Decreased strength;Impaired UE functional use   Rehab Potential Good   PT Frequency 2x / week   PT Duration 6 weeks   PT Next Visit Plan Add to HEP if  appropriate   PT Home Exercise Plan add active exercise with LT arm   Consulted and Agree with Plan of Care Patient        Problem List Patient Active Problem List   Diagnosis Date Noted  . Varicose veins of lower extremities with other complications 54/00/8676  . Chronic venous insufficiency 08/08/2011                                              Darrel Hoover PT 07/21/2014, 7:46 AM

## 2014-07-25 ENCOUNTER — Ambulatory Visit: Payer: 59

## 2014-07-25 DIAGNOSIS — R29898 Other symptoms and signs involving the musculoskeletal system: Secondary | ICD-10-CM

## 2014-07-25 DIAGNOSIS — Z5189 Encounter for other specified aftercare: Secondary | ICD-10-CM | POA: Diagnosis not present

## 2014-07-25 DIAGNOSIS — M25612 Stiffness of left shoulder, not elsewhere classified: Secondary | ICD-10-CM

## 2014-07-25 DIAGNOSIS — M25512 Pain in left shoulder: Secondary | ICD-10-CM

## 2014-07-25 NOTE — Patient Instructions (Signed)
Strengthening: Resisted Internal Rotation   Hold tubing in left hand, elbow at side and forearm out. Rotate forearm in across body. Repeat ____ times per set. Do ____ sets per session. Do ____ sessions per day.  http://orth.exer.us/830   Copyright  VHI. All rights reserved.  Strengthening: Resisted External Rotation   Hold tubing in right hand, elbow at side and forearm across body. Rotate forearm out. Repeat ____ times per set. Do ____ sets per session. Do ____ sessions per day.  http://orth.exer.us/828   Copyright  VHI. All rights reserved.  Strengthening: Resisted Flexion   Hold tubing with left arm at side. Pull forward and up. Move shoulder through pain-free range of motion. Repeat ____ times per set. Do ____ sets per session. Do ____ sessions per day.  http://orth.exer.us/824   Copyright  VHI. All rights reserved.  Strengthening: Resisted Extension   Hold tubing in right hand, arm forward. Pull arm back, elbow straight. Repeat ____ times per set. Do ____ sets per session. Do ____ sessions per day.  http://orth.exer.us/832   Copyright  VHI. All rights reserved.   

## 2014-07-25 NOTE — Therapy (Addendum)
Physical Therapy Treatment  Patient Details  Name: Felicia Burke MRN: [id number] Date of Birth: 1955-10-10  Encounter Date: 07/25/2014      PT End of Session - 07/25/14 0747    Visit Number 18   Number of Visits 30   PT Start Time 0702   PT Stop Time 0745   PT Time Calculation (min) 43 min   Activity Tolerance Patient tolerated treatment well  She reported feeling good   Behavior During Therapy [facility] for tasks assessed/performed      Past Medical History  Diagnosis Date  . Hypertension   . Asthma   . Diverticulosis   . Ovarian cyst   . Unspecified hemorrhoids with other complication   . Non-alcoholic fatty liver disease   . Arthritis   . Migraine   . Depression   . Gallstones   . Varicose veins   . Peripheral vascular disease   . DM (diabetes mellitus)     prior to Gastric bypass  . GERD (gastroesophageal reflux disease)   . Basal cell carcinoma     x 5  . Anemia     Past Surgical History  Procedure Laterality Date  . Cystocele repair    . Vaginal hysterectomy    . Cholecystectomy    . Bariatric surgery    . Bunionectomy      bilateral Left side redo  . Endovenous ablation saphenous vein w/ laser  01-22-2012    right greater saphenous vein and stab phlebectomy >20 incisions right leg    [name]  . Endovenous ablation saphenous vein w/ laser  02-26-2012    left greater saphenous vein and stab phlebectomy >20 left leg  . Carpal tunnel release Bilateral   . Shoulder arthroscopy with subacromial decompression, rotator cuff repair and bicep tendon repair Left 05/08/2014    Procedure: LEFT SHOULDER ARTHROSCOPY WITH SUBACROMIAL DECOMPRESSION,OPEN ROTATOR CUFF REPAIR  OPEN BICEP TENODESIS,  OPEN SUBSCAPULAR REPAIR;  Surgeon: [name], MD;  Location: [place];  Service: Orthopedics;  Laterality: Left;    There were no vitals taken for this visit.  Visit Diagnosis:  Pain in joint, shoulder region, left  Stiffness of glenohumeral joint, left  Weakness of  left upper extremity      Subjective Assessment - 07/25/14 0706    Symptoms IT s doing well and not worse after last session. Difficulty with lifting into cabinets   Currently in Pain? Yes   Pain Score 3    Pain Location Shoulder   Pain Orientation Left   Pain Descriptors / Indicators Sore   Pain Type Surgical pain   Pain Frequency Constant   Multiple Pain Sites No            [facility] Adult PT Treatment/Exercise - 07/25/14 0709    Shoulder Exercises: Supine   Protraction 12 reps  2 pounds with head and shoulder lift, Cues for armalignment    Shoulder Exercises: Prone   Flexion Left;12 reps;AROM   Extension 15 reps;Left;AROM   Horizontal ABduction 1 12 reps;Left;AROM   Other Prone Exercises press downs into orange ball x15 3 sec hold   Shoulder Exercises: Sidelying   External Rotation Left;15 reps;AROM   ABduction AROM;12 reps  2pounds   Other Sidelying Exercises horizontal abduction 2 pounds x12   Shoulder Exercises: Standing   Other Standing Exercises wall ladder x12    Shoulder Exercises: Pulleys   Flexion 2 minutes     Active motion LT shoulder flexion 110  Abduction 115     Ext rotation 45   Int rotation 25                         Passive motion LT shoulder Flexion 118   Abduction 120    Ext rotation 50   Int rotation 42             PT Education - 07/25/14 0708    Education provided Yes   Education Details [place]   Person(s) Educated Patient   Methods Explanation;Demonstration;Handout   Comprehension Verbalized understanding;Returned demonstration          PT Short Term Goals - 07/25/14 0749    PT SHORT TERM GOAL #3   Title Passive abduction to 90 degrees   Status Achieved            Plan - 07/25/14 0748    Clinical Impression Statement Felicia Burke reported pleased with progress.  Continues with weakness and decreased range   Pt will benefit from skilled therapeutic intervention in order to improve on the following deficits Decreased range of  motion;Decreased strength;Impaired UE functional use   Rehab Potential Good   PT Frequency 2x / week   PT Duration 6 weeks   PT Treatment/Interventions Patient/family education;Therapeutic exercise;Manual techniques;Passive range of motion   PT Next Visit Plan Add to HEP if appropriate   PT Home Exercise Plan add active exercise with LT arm   Consulted and Agree with Plan of Care Patient        Problem List Patient Active Problem List   Diagnosis Date Noted  . Varicose veins of lower extremities with other complications 13/04/6577  . Chronic venous insufficiency 08/08/2011                                              Darrel Hoover PT 07/25/2014, 7:51 AM

## 2014-07-28 ENCOUNTER — Ambulatory Visit: Payer: 59

## 2014-07-28 DIAGNOSIS — M25512 Pain in left shoulder: Secondary | ICD-10-CM

## 2014-07-28 DIAGNOSIS — M25612 Stiffness of left shoulder, not elsewhere classified: Secondary | ICD-10-CM

## 2014-07-28 DIAGNOSIS — Z5189 Encounter for other specified aftercare: Secondary | ICD-10-CM | POA: Diagnosis not present

## 2014-07-28 DIAGNOSIS — R29898 Other symptoms and signs involving the musculoskeletal system: Secondary | ICD-10-CM

## 2014-07-28 NOTE — Patient Instructions (Signed)
Felicia Burke was asked to rest arm on pillow at work and home as able and contiue to ice daily. She was asked not to do anything strenuous with arm.

## 2014-07-28 NOTE — Therapy (Signed)
Physical Therapy Treatment  Patient Details  Name: Felicia Burke MRN: [id number] Date of Birth: [birth year]  Encounter Date: 07/28/2014      PT End of Session - 07/28/14 0742    Visit Number 19   Number of Visits 30   PT Start Time 0702   PT Stop Time 0740   PT Time Calculation (min) 38 min   Activity Tolerance Patient tolerated treatment well  She reported feeling much better post.    Behavior During Therapy [facility] for tasks assessed/performed      Past Medical History  Diagnosis Date  . Hypertension   . Asthma   . Diverticulosis   . Ovarian cyst   . Unspecified hemorrhoids with other complication   . Non-alcoholic fatty liver disease   . Arthritis   . Migraine   . Depression   . Gallstones   . Varicose veins   . Peripheral vascular disease   . DM (diabetes mellitus)     prior to Gastric bypass  . GERD (gastroesophageal reflux disease)   . Basal cell carcinoma     x 5  . Anemia     Past Surgical History  Procedure Laterality Date  . Cystocele repair    . Vaginal hysterectomy    . Cholecystectomy    . Bariatric surgery    . Bunionectomy      bilateral Left side redo  . Endovenous ablation saphenous vein w/ laser  01-22-2012    right greater saphenous vein and stab phlebectomy >20 incisions right leg    [name]  . Endovenous ablation saphenous vein w/ laser  02-26-2012    left greater saphenous vein and stab phlebectomy >20 left leg  . Carpal tunnel release Bilateral   . Shoulder arthroscopy with subacromial decompression, rotator cuff repair and bicep tendon repair Left 05/08/2014    Procedure: LEFT SHOULDER ARTHROSCOPY WITH SUBACROMIAL DECOMPRESSION,OPEN ROTATOR CUFF REPAIR  OPEN BICEP TENODESIS,  OPEN SUBSCAPULAR REPAIR;  Surgeon: [name], MD;  Location: [place];  Service: Orthopedics;  Laterality: Left;    There were no vitals taken for this visit.  Visit Diagnosis:  Pain in joint, shoulder region, left  Stiffness of glenohumeral joint,  left  Weakness of left upper extremity      Subjective Assessment - 07/28/14 0705    Symptoms More sore since last visit. Icing alot and resting more   Patient Stated Goals Return to normal activity/use of LT arm without pain.    Currently in Pain? Yes   Pain Score 6    Pain Location Shoulder   Pain Orientation Left   Pain Descriptors / Indicators Aching  constand and deep    Pain Type Surgical pain   Pain Onset More than a month ago   Pain Frequency Constant   Aggravating Factors  using arm and at rest now   Pain Relieving Factors cold, medication   Effect of Pain on Daily Activities limited work and home tasks   Multiple Pain Sites No          [facility] PT Assessment - 07/28/14 0710    AROM   Left Shoulder Flexion 132 Degrees   Left Shoulder ABduction 132 Degrees   Left Shoulder Internal Rotation --  65 degrees supine   Left Shoulder External Rotation --  45 degrees supine          [facility] Adult PT Treatment/Exercise - 07/28/14 0707    Shoulder Exercises: Standing   Other Standing Exercises wall  ladder    Shoulder Exercises: Pulleys   Flexion 3 minutes   Manual Therapy   Manual Therapy Joint mobilization   Joint Mobilization Gr 2 distraction and AP glides for pain control   Manual Therapy Massage   Manual Therapy   Massage to LT shoulder soft tissue           PT Education - 07/28/14 0742    Education provided Yes   Education Details rest and pain control   Person(s) Educated Patient   Methods Explanation   Comprehension Verbalized understanding              Plan - 07/28/14 0743    Clinical Impression Statement She was much improved post manual treatment and we need to make sure she is calmed own before resuming strengthening   Pt will benefit from skilled therapeutic intervention in order to improve on the following deficits Decreased range of motion;Decreased strength;Impaired UE functional use   Rehab Potential Good   PT Frequency 2x / week   PT  Duration 6 weeks   PT Treatment/Interventions Patient/family education;Therapeutic exercise;Manual techniques;Passive range of motion   PT Next Visit Plan Add to HEP if appropriate   PT Home Exercise Plan add active exercise with LT arm   Consulted and Agree with Plan of Care Patient        Problem List Patient Active Problem List   Diagnosis Date Noted  . Varicose veins of lower extremities with other complications 09/62/8366  . Chronic venous insufficiency 08/08/2011                                              Darrel Hoover PT 07/28/2014, 7:45 AM

## 2014-07-31 ENCOUNTER — Ambulatory Visit: Payer: 59

## 2014-07-31 DIAGNOSIS — M25512 Pain in left shoulder: Secondary | ICD-10-CM

## 2014-07-31 DIAGNOSIS — M25612 Stiffness of left shoulder, not elsewhere classified: Secondary | ICD-10-CM

## 2014-07-31 DIAGNOSIS — R29898 Other symptoms and signs involving the musculoskeletal system: Secondary | ICD-10-CM

## 2014-07-31 DIAGNOSIS — Z5189 Encounter for other specified aftercare: Secondary | ICD-10-CM | POA: Diagnosis not present

## 2014-07-31 NOTE — Patient Instructions (Signed)
She was asked to not do any lifting except glass and to rest as able with arm on pllow

## 2014-07-31 NOTE — Therapy (Signed)
Physical Therapy Treatment  Patient Details  Name: Felicia Burke MRN: [id number] Date of Birth: [birth year]  Encounter Date: 07/31/2014      PT End of Session - 07/31/14 0749    Visit Number 20   Number of Visits 30   Date for PT Re-Evaluation 08/02/14   PT Start Time 0706   PT Stop Time 0745   PT Time Calculation (min) 39 min   Activity Tolerance Patient tolerated treatment well  She reported feeling best in past week   Behavior During Therapy [facility] for tasks assessed/performed      Past Medical History  Diagnosis Date  . Hypertension   . Asthma   . Diverticulosis   . Ovarian cyst   . Unspecified hemorrhoids with other complication   . Non-alcoholic fatty liver disease   . Arthritis   . Migraine   . Depression   . Gallstones   . Varicose veins   . Peripheral vascular disease   . DM (diabetes mellitus)     prior to Gastric bypass  . GERD (gastroesophageal reflux disease)   . Basal cell carcinoma     x 5  . Anemia     Past Surgical History  Procedure Laterality Date  . Cystocele repair    . Vaginal hysterectomy    . Cholecystectomy    . Bariatric surgery    . Bunionectomy      bilateral Left side redo  . Endovenous ablation saphenous vein w/ laser  01-22-2012    right greater saphenous vein and stab phlebectomy >20 incisions right leg    [name]  . Endovenous ablation saphenous vein w/ laser  02-26-2012    left greater saphenous vein and stab phlebectomy >20 left leg  . Carpal tunnel release Bilateral   . Shoulder arthroscopy with subacromial decompression, rotator cuff repair and bicep tendon repair Left 05/08/2014    Procedure: LEFT SHOULDER ARTHROSCOPY WITH SUBACROMIAL DECOMPRESSION,OPEN ROTATOR CUFF REPAIR  OPEN BICEP TENODESIS,  OPEN SUBSCAPULAR REPAIR;  Surgeon: [name], MD;  Location: [place];  Service: Orthopedics;  Laterality: Left;    There were no vitals taken for this visit.  Visit Diagnosis:  Pain in joint, shoulder region,  left  Stiffness of glenohumeral joint, left  Weakness of left upper extremity      Subjective Assessment - 07/31/14 0708    Symptoms Better. took it easy . Used arm but for light activity.    Currently in Pain? Yes   Pain Score 3    Pain Location Shoulder   Pain Orientation Left   Pain Descriptors / Indicators Aching   Pain Type Surgical pain   Pain Onset More than a month ago   Pain Frequency Constant   Aggravating Factors  using arm   Pain Relieving Factors cold, medicaiton   Effect of Pain on Daily Activities limited at work and home   Multiple Pain Sites No            [facility] Adult PT Treatment/Exercise - 07/31/14 0710    Shoulder Exercises: Standing   Other Standing Exercises wall ladder to comfort .27 rung   Shoulder Exercises: Pulleys   Flexion --  4 minutes   Ultrasound   Ultrasound Location LT shoulder   Ultrasound Parameters 100% 1MHZ  1.5 Wcm2   Ultrasound Goals Pain   Manual Therapy   Manual Therapy Joint mobilization   Joint Mobilization Gr 2-3 distraction and AP, PA glides   Manual Therapy Massage  STW  to LT shoulder                Plan - 07/31/14 0750    Clinical Impression Statement Will see how she is doing and resume some isometrics and light exercise   Pt will benefit from skilled therapeutic intervention in order to improve on the following deficits Decreased range of motion;Decreased strength;Impaired UE functional use   Rehab Potential Good   PT Frequency 2x / week   PT Duration --  5 weeks   PT Treatment/Interventions Patient/family education;Therapeutic exercise;Manual techniques;Passive range of motion   PT Next Visit Plan Add to HEP if appropriate   PT Home Exercise Plan add active exercise with LT arm   Consulted and Agree with Plan of Care Patient        Problem List Patient Active Problem List   Diagnosis Date Noted  . Varicose veins of lower extremities with other complications 19/16/6060  . Chronic venous  insufficiency 08/08/2011                                              Darrel Hoover PT 07/31/2014, 7:54 AM

## 2014-08-02 ENCOUNTER — Ambulatory Visit: Payer: 59

## 2014-08-02 DIAGNOSIS — Z5189 Encounter for other specified aftercare: Secondary | ICD-10-CM | POA: Diagnosis not present

## 2014-08-02 DIAGNOSIS — M25512 Pain in left shoulder: Secondary | ICD-10-CM

## 2014-08-02 DIAGNOSIS — R29898 Other symptoms and signs involving the musculoskeletal system: Secondary | ICD-10-CM

## 2014-08-02 DIAGNOSIS — M25612 Stiffness of left shoulder, not elsewhere classified: Secondary | ICD-10-CM

## 2014-08-02 NOTE — Therapy (Addendum)
Physical Therapy Treatment  Patient Details  Name: Felicia Burke MRN: [id number] Date of Birth: May 02, 1956  Encounter Date: 08/02/2014      PT End of Session - 08/02/14 0746    Visit Number 21   Date for PT Re-Evaluation 09/01/14   PT Start Time 0700   PT Stop Time 0742   PT Time Calculation (min) 42 min   Activity Tolerance Patient tolerated treatment well  Feels great   Behavior During Therapy [facility] for tasks assessed/performed      Past Medical History  Diagnosis Date  . Hypertension   . Asthma   . Diverticulosis   . Ovarian cyst   . Unspecified hemorrhoids with other complication   . Non-alcoholic fatty liver disease   . Arthritis   . Migraine   . Depression   . Gallstones   . Varicose veins   . Peripheral vascular disease   . DM (diabetes mellitus)     prior to Gastric bypass  . GERD (gastroesophageal reflux disease)   . Basal cell carcinoma     x 5  . Anemia     Past Surgical History  Procedure Laterality Date  . Cystocele repair    . Vaginal hysterectomy    . Cholecystectomy    . Bariatric surgery    . Bunionectomy      bilateral Left side redo  . Endovenous ablation saphenous vein w/ laser  01-22-2012    right greater saphenous vein and stab phlebectomy >20 incisions right leg    [name]  . Endovenous ablation saphenous vein w/ laser  02-26-2012    left greater saphenous vein and stab phlebectomy >20 left leg  . Carpal tunnel release Bilateral   . Shoulder arthroscopy with subacromial decompression, rotator cuff repair and bicep tendon repair Left 05/08/2014    Procedure: LEFT SHOULDER ARTHROSCOPY WITH SUBACROMIAL DECOMPRESSION,OPEN ROTATOR CUFF REPAIR  OPEN BICEP TENODESIS,  OPEN SUBSCAPULAR REPAIR;  Surgeon: [name], MD;  Location: [place];  Service: Orthopedics;  Laterality: Left;    There were no vitals taken for this visit.  Visit Diagnosis:  Pain in joint, shoulder region, left  Stiffness of glenohumeral joint, left  Weakness  of left upper extremity      Subjective Assessment - 08/02/14 0701    Symptoms Shoulder better and still has some sharp pain in middle to anterior deltoid with overhead reaching   Patient Stated Goals Return to normal activity/use of LT arm without pain.    Currently in Pain? Yes   Pain Score 0-No pain  None at rest and 3/10 with arm overhead   Pain Location Shoulder   Pain Orientation Left   Pain Type Surgical pain   Pain Onset More than a month ago   Pain Frequency Intermittent   Aggravating Factors  reaching over head   Pain Relieving Factors meds ,cold ,rest   Effect of Pain on Daily Activities limited at home and work   Multiple Pain Sites No            [facility] Adult PT Treatment/Exercise - 08/02/14 0705    Shoulder Exercises: Standing   Other Standing Exercises wall ladder x 6 with medium stretch   Shoulder Exercises: Pulleys   Flexion 3 minutes   Ultrasound   Ultrasound Location LT shoulder   Ultrasound Parameters 100% i MHz 1.6 Wcm2   Ultrasound Goals Pain   Manual Therapy   Manual Therapy Massage;Myofascial release   Shoulder Exercises: Isometric Strengthening  Flexion 5X10"   Extension 5X10"   External Rotation 5X10"   Internal Rotation 5X10"   ABduction 5X10"   ADduction 5X10"              PT Long Term Goals - 08/02/14 0748    PT LONG TERM GOAL #1   Title Demonstrrate understanding of use of cold for pain   Status Achieved   PT LONG TERM GOAL #2   Title Independent with advanced HEP   Baseline Pain ha slowed progress with Exercise   Time 4   Period Weeks   Status On-going   PT LONG TERM GOAL #3   Title Pain decreased 75% with normla activity at work and home   Baseline She is much improved with minimal exercise but we will be cautious with pushing srengthening   Time 4   Status On-going   PT LONG TERM GOAL #4   Title Independent selfcare   Status Achieved   PT LONG TERM GOAL #5   Title return to light to moderate activity at home with  minmal pain   Baseline She is doing light activity but not moderate activity   Time 4   Status On-going          Plan - 08/02/14 0747    Clinical Impression Statement Much improve with decreased pain. She tolerated isometrics so will continue and progress strength as able. No goals met as she /we had to pull back a little due to pain.    Pt will benefit from skilled therapeutic intervention in order to improve on the following deficits Decreased range of motion;Decreased strength;Impaired UE functional use   Rehab Potential Good   PT Frequency 4x / week   PT Treatment/Interventions Patient/family education;Therapeutic exercise;Manual techniques;Passive range of motion   PT Next Visit Plan Add to HEP if appropriate   PT Home Exercise Plan add active exercise with LT arm  if pain continued improved   Consulted and Agree with Plan of Care Patient        Problem List Patient Active Problem List   Diagnosis Date Noted  . Varicose veins of lower extremities with other complications 76/19/5093  . Chronic venous insufficiency 08/08/2011     FOT Score improved for visit 20 to 37% limitation from 69%                                         Darrel Hoover PT 08/02/2014, 7:51 AM

## 2014-08-02 NOTE — Therapy (Signed)
Physical Therapy Treatment  Patient Details  Name: Felicia Burke MRN: [id number] Date of Birth: [birth year]  Encounter Date: 08/02/2014    Past Medical History  Diagnosis Date  . Hypertension   . Asthma   . Diverticulosis   . Ovarian cyst   . Unspecified hemorrhoids with other complication   . Non-alcoholic fatty liver disease   . Arthritis   . Migraine   . Depression   . Gallstones   . Varicose veins   . Peripheral vascular disease   . DM (diabetes mellitus)     prior to Gastric bypass  . GERD (gastroesophageal reflux disease)   . Basal cell carcinoma     x 5  . Anemia     Past Surgical History  Procedure Laterality Date  . Cystocele repair    . Vaginal hysterectomy    . Cholecystectomy    . Bariatric surgery    . Bunionectomy      bilateral Left side redo  . Endovenous ablation saphenous vein w/ laser  01-22-2012    right greater saphenous vein and stab phlebectomy >20 incisions right leg    [name]  . Endovenous ablation saphenous vein w/ laser  02-26-2012    left greater saphenous vein and stab phlebectomy >20 left leg  . Carpal tunnel release Bilateral   . Shoulder arthroscopy with subacromial decompression, rotator cuff repair and bicep tendon repair Left 05/08/2014    Procedure: LEFT SHOULDER ARTHROSCOPY WITH SUBACROMIAL DECOMPRESSION,OPEN ROTATOR CUFF REPAIR  OPEN BICEP TENODESIS,  OPEN SUBSCAPULAR REPAIR;  Surgeon: [name], MD;  Location: [place];  Service: Orthopedics;  Laterality: Left;    There were no vitals taken for this visit.  Visit Diagnosis:  Pain in joint, shoulder region, left  Stiffness of glenohumeral joint, left  Weakness of left upper extremity      Subjective Assessment - 08/02/14 0701    Symptoms Shoulder better and still has some sharp pain in middle to anterior deltoid with overhead reaching   Patient Stated Goals Return to normal activity/use of LT arm without pain.    Currently in Pain? Yes   Pain Score 0-No pain   None at rest and 3/10 with arm overhead   Pain Location Shoulder   Pain Orientation Left   Pain Type Surgical pain   Pain Onset More than a month ago   Pain Frequency Intermittent   Aggravating Factors  reaching over head   Pain Relieving Factors meds ,cold ,rest   Effect of Pain on Daily Activities limited at home and work   Multiple Pain Sites No            [facility] Adult PT Treatment/Exercise - 08/02/14 0705    Shoulder Exercises: Standing   Other Standing Exercises wall ladder x 6 with medium stretch   Shoulder Exercises: Pulleys   Flexion 3 minutes   Ultrasound   Ultrasound Location LT shoulder   Ultrasound Parameters 100% i MHz 1.6 Wcm2   Ultrasound Goals Pain   Manual Therapy   Manual Therapy Massage;Myofascial release   Shoulder Exercises: Isometric Strengthening   Flexion 5X10"   Extension 5X10"   External Rotation 5X10"   Internal Rotation 5X10"   ABduction 5X10"   ADduction 5X10"                Problem List Patient Active Problem List   Diagnosis Date Noted  . Varicose veins of lower extremities with other complications 62/22/9798  . Chronic venous insufficiency  08/08/2011                                              Darrel Hoover PT 08/02/2014, 7:44 AM

## 2014-08-07 ENCOUNTER — Ambulatory Visit: Payer: 59

## 2014-08-07 DIAGNOSIS — M25512 Pain in left shoulder: Secondary | ICD-10-CM

## 2014-08-07 DIAGNOSIS — M25612 Stiffness of left shoulder, not elsewhere classified: Secondary | ICD-10-CM

## 2014-08-07 DIAGNOSIS — Z5189 Encounter for other specified aftercare: Secondary | ICD-10-CM | POA: Diagnosis not present

## 2014-08-07 DIAGNOSIS — R29898 Other symptoms and signs involving the musculoskeletal system: Secondary | ICD-10-CM

## 2014-08-07 NOTE — Patient Instructions (Signed)
She will continue isometrics and gentle stretching to LT shoulder

## 2014-08-07 NOTE — Therapy (Signed)
Physical Therapy Treatment  Patient Details  Name: Felicia Burke MRN: [id number] Date of Birth: 10-17-1955  Encounter Date: 08/07/2014      PT End of Session - 08/07/14 0752    Visit Number 22   Number of Visits 30   Date for PT Re-Evaluation 09/01/14   PT Start Time 0658   PT Stop Time 0752   PT Time Calculation (min) 54 min   Activity Tolerance Patient tolerated treatment well   Behavior During Therapy [facility] for tasks assessed/performed      Past Medical History  Diagnosis Date  . Hypertension   . Asthma   . Diverticulosis   . Ovarian cyst   . Unspecified hemorrhoids with other complication   . Non-alcoholic fatty liver disease   . Arthritis   . Migraine   . Depression   . Gallstones   . Varicose veins   . Peripheral vascular disease   . DM (diabetes mellitus)     prior to Gastric bypass  . GERD (gastroesophageal reflux disease)   . Basal cell carcinoma     x 5  . Anemia     Past Surgical History  Procedure Laterality Date  . Cystocele repair    . Vaginal hysterectomy    . Cholecystectomy    . Bariatric surgery    . Bunionectomy      bilateral Left side redo  . Endovenous ablation saphenous vein w/ laser  01-22-2012    right greater saphenous vein and stab phlebectomy >20 incisions right leg    [name]  . Endovenous ablation saphenous vein w/ laser  02-26-2012    left greater saphenous vein and stab phlebectomy >20 left leg  . Carpal tunnel release Bilateral   . Shoulder arthroscopy with subacromial decompression, rotator cuff repair and bicep tendon repair Left 05/08/2014    Procedure: LEFT SHOULDER ARTHROSCOPY WITH SUBACROMIAL DECOMPRESSION,OPEN ROTATOR CUFF REPAIR  OPEN BICEP TENODESIS,  OPEN SUBSCAPULAR REPAIR;  Surgeon: [name], MD;  Location: [place];  Service: Orthopedics;  Laterality: Left;    There were Burke vitals taken for this visit.  Visit Diagnosis:  Pain in joint, shoulder region, left  Stiffness of glenohumeral joint,  left  Weakness of left upper extremity      Subjective Assessment - 08/07/14 0658    Symptoms Shoulder with Burke pain at rest but with use she began to have popping and clicking this weekend. Now she has 1/10 at rest and 2-3 with stretching   Patient Stated Goals Return to normal activity/use of LT arm without pain.    Currently in Pain? Yes   Pain Score 2    Pain Location Shoulder   Pain Orientation Left   Pain Descriptors / Indicators Aching   Pain Onset More than a month ago   Pain Frequency Intermittent   Aggravating Factors  Using arm nad reaching   Pain Relieving Factors Meds and cold   Effect of Pain on Daily Activities Limited home and work with reaching and lifiting   Multiple Pain Sites Burke            [facility] Adult PT Treatment/Exercise - 08/07/14 0700    Shoulder Exercises: Supine   External Rotation AROM;15 reps   Internal Rotation AROM;15 reps   Flexion AROM;15 reps   ABduction AROM;15 reps   Shoulder Exercises: Standing   Other Standing Exercises Wall ladder x 8 with medium stretch   Other Standing Exercises Closed chain hand on wall 90 degrees  flex with scapula protraction and retract and elevation and depression.   Shoulder Exercises: Pulleys   Flexion 3 minutes   Manual Therapy   Manual Therapy --  with soft tissue work to anterior shoulder.    Joint Mobilization Gr 2-3 distractiona nd postreior and anterior glides.    Shoulder Exercises: Therapy Ball   Other Therapy Ball Exercises Sitting with closed chani pressure with sircles x 25 reps clock and counter clocwise.           PT Education - 08/07/14 0752    Education provided Burke              Plan - 08/07/14 0753    Clinical Impression Statement Burke increased pain post session. Noted crepitus anterior shoulder.   Pt will benefit from skilled therapeutic intervention in order to improve on the following deficits Decreased range of motion;Decreased strength;Impaired UE functional use   Rehab  Potential Good   PT Frequency 2x / week   PT Duration 4 weeks   PT Treatment/Interventions Patient/family education;Therapeutic exercise;Manual techniques;Passive range of motion   PT Next Visit Plan Add to HEP if appropriate   PT Home Exercise Plan add active exercise with LT arm  if pain continued improved   Consulted and Agree with Plan of Care Patient        Problem List Patient Active Problem List   Diagnosis Date Noted  . Varicose veins of lower extremities with other complications 21/07/5519  . Chronic venous insufficiency 08/08/2011                                              Darrel Hoover PT 08/07/2014, 7:55 AM

## 2014-08-14 ENCOUNTER — Other Ambulatory Visit: Payer: Self-pay | Admitting: Obstetrics and Gynecology

## 2014-08-14 ENCOUNTER — Ambulatory Visit
Admission: RE | Admit: 2014-08-14 | Discharge: 2014-08-14 | Disposition: A | Discharge status: Home / Self Care | Payer: 59 | Source: Ambulatory Visit | Attending: Obstetrics and Gynecology | Admitting: Obstetrics and Gynecology

## 2014-08-14 ENCOUNTER — Ambulatory Visit
Admission: RE | Admit: 2014-08-14 | Discharge: 2014-08-14 | Disposition: A | Discharge status: Home / Self Care | Payer: 59 | Source: Ambulatory Visit

## 2014-08-14 ENCOUNTER — Ambulatory Visit: Payer: 59 | Attending: Orthopedic Surgery

## 2014-08-14 DIAGNOSIS — R293 Abnormal posture: Secondary | ICD-10-CM | POA: Diagnosis not present

## 2014-08-14 DIAGNOSIS — M8580 Other specified disorders of bone density and structure, unspecified site: Secondary | ICD-10-CM

## 2014-08-14 DIAGNOSIS — Z780 Asymptomatic menopausal state: Secondary | ICD-10-CM

## 2014-08-14 DIAGNOSIS — M25512 Pain in left shoulder: Secondary | ICD-10-CM | POA: Diagnosis not present

## 2014-08-14 DIAGNOSIS — M25612 Stiffness of left shoulder, not elsewhere classified: Secondary | ICD-10-CM | POA: Diagnosis not present

## 2014-08-14 DIAGNOSIS — Z1231 Encounter for screening mammogram for malignant neoplasm of breast: Secondary | ICD-10-CM

## 2014-08-14 DIAGNOSIS — Z5189 Encounter for other specified aftercare: Secondary | ICD-10-CM | POA: Diagnosis not present

## 2014-08-14 DIAGNOSIS — M6281 Muscle weakness (generalized): Secondary | ICD-10-CM | POA: Insufficient documentation

## 2014-08-14 DIAGNOSIS — R29898 Other symptoms and signs involving the musculoskeletal system: Secondary | ICD-10-CM

## 2014-08-14 NOTE — Therapy (Signed)
Outpatient Rehabilitation [facility] [address] [place], [place], [address] Phone: [phone]   Fax:  [phone]  Physical Therapy Treatment  Patient Details  Name: Felicia Burke MRN: [id number] Date of Birth: 10/14/1955  Encounter Date: 08/14/2014      PT End of Session - 08/14/14 1014    PT Start Time 0930   PT Stop Time 1012   PT Time Calculation (min) 42 min      Past Medical History  Diagnosis Date  . Hypertension   . Asthma   . Diverticulosis   . Ovarian cyst   . Unspecified hemorrhoids with other complication   . Non-alcoholic fatty liver disease   . Arthritis   . Migraine   . Depression   . Gallstones   . Varicose veins   . Peripheral vascular disease   . DM (diabetes mellitus)     prior to Gastric bypass  . GERD (gastroesophageal reflux disease)   . Basal cell carcinoma     x 5  . Anemia     Past Surgical History  Procedure Laterality Date  . Cystocele repair    . Vaginal hysterectomy    . Cholecystectomy    . Bariatric surgery    . Bunionectomy      bilateral Left side redo  . Endovenous ablation saphenous vein w/ laser  01-22-2012    right greater saphenous vein and stab phlebectomy >20 incisions right leg    [name]  . Endovenous ablation saphenous vein w/ laser  02-26-2012    left greater saphenous vein and stab phlebectomy >20 left leg  . Carpal tunnel release Bilateral   . Shoulder arthroscopy with subacromial decompression, rotator cuff repair and bicep tendon repair Left 05/08/2014    Procedure: LEFT SHOULDER ARTHROSCOPY WITH SUBACROMIAL DECOMPRESSION,OPEN ROTATOR CUFF REPAIR  OPEN BICEP TENODESIS,  OPEN SUBSCAPULAR REPAIR;  Surgeon: [name], MD;  Location: [place];  Service: Orthopedics;  Laterality: Left;    There were no vitals taken for this visit.  Visit Diagnosis:  Pain in joint, shoulder region, left  Stiffness of glenohumeral joint, left  Weakness of left upper extremity      Subjective  Assessment - 08/14/14 0930    Symptoms Mild Lt shoulder pain. 1-2/10 but doesn't stop her. Continues with popping in LT shoulder. Saw MD and he said popping OK and may be catching on rough area of bone.    Patient Stated Goals Return to normal activity/use of LT arm without pain.    Currently in Pain? Yes   Pain Score 1    Pain Location Shoulder   Pain Orientation Left   Pain Descriptors / Indicators Aching   Pain Type Surgical pain   Pain Onset More than a month ago   Aggravating Factors  Using arm   Pain Relieving Factors MEds and cold   Effect of Pain on Daily Activities Limited home tasks    Multiple Pain Sites No            [facility] Adult PT Treatment/Exercise - 08/14/14 0933    Shoulder Exercises: Supine   Protraction Strengthening;15 reps;Left   Protraction Weight (lbs) 3   Horizontal ABduction Left;AROM;20 reps  with hor adduction    Flexion Left;15 reps   Shoulder Flexion Weight (lbs) 2 pounds    Shoulder Exercises: Seated   Retraction Left;AROM  worked on scapula control and retraction/depression   Other Seated Exercises UBE 5 minutes L1   Other Seated Exercises Elbow curls  3 pounds x 20   Shoulder Exercises: Sidelying   External Rotation Left;12 reps   External Rotation Weight (lbs) 2 then 12 reps with no weight   ABduction Left;15 reps   ABduction Weight (lbs) 2 pounds   Other Sidelying Exercises Hor Abduction 2 pounds x 12   Other Sidelying Exercises Ball on wall x 20 clockwise and counter clockwise circles   Shoulder Exercises: Standing   Other Standing Exercises Wall ladder x 8 with medium stretch   Shoulder Exercises: Pulleys   Flexion --  4 minutes                Plan - 08/14/14 1012    Clinical Impression Statement Mild incr pain post session Sh e was issued ice bag to take with her. Cont weakness    Pt will benefit from skilled therapeutic intervention in order to improve on the following deficits Decreased range of motion;Decreased  strength;Impaired UE functional use   Rehab Potential Good   PT Frequency 2x / week   PT Duration 3 weeks   PT Treatment/Interventions Patient/family education;Therapeutic exercise;Manual techniques;Passive range of motion   PT Next Visit Plan Add to HEP if appropriate   PT Home Exercise Plan add active exercise with LT arm  if pain continued improved   Consulted and Agree with Plan of Care Patient                               Problem List Patient Active Problem List   Diagnosis Date Noted  . Varicose veins of lower extremities with other complications 81/27/5170  . Chronic venous insufficiency 08/08/2011    Darrel Hoover PT 08/14/2014, 10:15 AM

## 2014-08-14 NOTE — Patient Instructions (Signed)
Asked pt to ice and take medications as needed as we resumed some weights today.

## 2014-08-18 ENCOUNTER — Other Ambulatory Visit: Payer: Self-pay

## 2014-08-18 ENCOUNTER — Other Ambulatory Visit: Payer: Self-pay | Admitting: Obstetrics and Gynecology

## 2014-08-18 ENCOUNTER — Ambulatory Visit: Payer: 59

## 2014-08-18 DIAGNOSIS — Z5189 Encounter for other specified aftercare: Secondary | ICD-10-CM | POA: Diagnosis not present

## 2014-08-18 DIAGNOSIS — M25512 Pain in left shoulder: Secondary | ICD-10-CM

## 2014-08-18 DIAGNOSIS — M25612 Stiffness of left shoulder, not elsewhere classified: Secondary | ICD-10-CM

## 2014-08-18 DIAGNOSIS — Z1231 Encounter for screening mammogram for malignant neoplasm of breast: Secondary | ICD-10-CM

## 2014-08-18 DIAGNOSIS — R29898 Other symptoms and signs involving the musculoskeletal system: Secondary | ICD-10-CM

## 2014-08-18 NOTE — Patient Instructions (Signed)
I worked with Ms [employer] for [employer] to decr shoulder strain with lifting and carrying. She was able to use good mechanics but she needed  cues to keep load close . She stated she would practice at home

## 2014-08-18 NOTE — Therapy (Signed)
Outpatient Rehabilitation [facility] [address] [place], [place], [address] Phone: [phone]   Fax:  [phone]  Physical Therapy Treatment  Patient Details  Name: HARLAN VINAL MRN: [id number] Date of Birth: 10-26-1955  Encounter Date: 08/18/2014      PT End of Session - 08/18/14 0751    Visit Number 23   Number of Visits 30   Date for PT Re-Evaluation 09/01/14   Activity Tolerance Patient tolerated treatment well  PAin unchanged   Behavior During Therapy [facility] for tasks assessed/performed      Past Medical History  Diagnosis Date  . Hypertension   . Asthma   . Diverticulosis   . Ovarian cyst   . Unspecified hemorrhoids with other complication   . Non-alcoholic fatty liver disease   . Arthritis   . Migraine   . Depression   . Gallstones   . Varicose veins   . Peripheral vascular disease   . DM (diabetes mellitus)     prior to Gastric bypass  . GERD (gastroesophageal reflux disease)   . Basal cell carcinoma     x 5  . Anemia     Past Surgical History  Procedure Laterality Date  . Cystocele repair    . Vaginal hysterectomy    . Cholecystectomy    . Bariatric surgery    . Bunionectomy      bilateral Left side redo  . Endovenous ablation saphenous vein w/ laser  01-22-2012    right greater saphenous vein and stab phlebectomy >20 incisions right leg    [name]  . Endovenous ablation saphenous vein w/ laser  02-26-2012    left greater saphenous vein and stab phlebectomy >20 left leg  . Carpal tunnel release Bilateral   . Shoulder arthroscopy with subacromial decompression, rotator cuff repair and bicep tendon repair Left 05/08/2014    Procedure: LEFT SHOULDER ARTHROSCOPY WITH SUBACROMIAL DECOMPRESSION,OPEN ROTATOR CUFF REPAIR  OPEN BICEP TENODESIS,  OPEN SUBSCAPULAR REPAIR;  Surgeon: [name], MD;  Location: [place];  Service: Orthopedics;  Laterality: Left;    There were no vitals taken for this visit.  Visit Diagnosis:  Pain in  joint, shoulder region, left  Stiffness of glenohumeral joint, left  Weakness of left upper extremity      Subjective Assessment - 08/18/14 0705    Symptoms Good couple of days. Felt good until yesterday when became tender.    Currently in Pain? Yes   Pain Score 1    Pain Location Shoulder   Pain Orientation Left   Pain Descriptors / Indicators Aching   Pain Type Surgical pain   Pain Onset More than a month ago   Pain Frequency Intermittent   Aggravating Factors  using arm   Pain Relieving Factors Meds and rest   Effect of Pain on Daily Activities Limited  with home tasks   Multiple Pain Sites No            [facility] Adult PT Treatment/Exercise - 08/18/14 0707    Shoulder Exercises: Standing   Other Standing Exercises Wall ladder x 19 with medium stretch   Other Standing Exercises Pushups on orange ball on counter x15 reps   Shoulder Exercises: Pulleys   Flexion --  5 minutes   Shoulder Exercises: ROM/Strengthening   UBE (Upper Arm Bike) L2 5 min          PT Education - 08/18/14 0751    Education provided Yes   Education Details lifting and body mechanics  Person(s) Educated Patient   Methods Explanation;Demonstration;Tactile cues;Verbal cues   Comprehension Verbalized understanding;Returned demonstration     We practiced single arm lift and carry with max 15 pounds and carry 250 feet 10 pounds without increased pain. We also worked on lifting 2 Doctor, general practice. We spent extra time on body mechanics to protect shoulder.          Plan - 08/18/14 0752    Clinical Impression Statement She cintinues with weakness and we need to be careful not to flare up pain. She is progressing toward goalx   Pt will benefit from skilled therapeutic intervention in order to improve on the following deficits Decreased range of motion;Decreased strength;Impaired UE functional use   Rehab Potential Good   PT Frequency 2x / week   PT Duration 3 weeks   PT  Treatment/Interventions Patient/family education;Therapeutic exercise;Manual techniques;Passive range of motion   PT Next Visit Plan Add to HEP if appropriate   PT Home Exercise Plan add active exercise with LT arm  if pain continued improved   Consulted and Agree with Plan of Care Patient                               Problem List Patient Active Problem List   Diagnosis Date Noted  . Varicose veins of lower extremities with other complications 78/67/6720  . Chronic venous insufficiency 08/08/2011    Darrel Hoover PT 08/18/2014, 7:54 AM

## 2014-08-21 ENCOUNTER — Ambulatory Visit: Payer: 59

## 2014-08-21 DIAGNOSIS — M25612 Stiffness of left shoulder, not elsewhere classified: Secondary | ICD-10-CM

## 2014-08-21 DIAGNOSIS — R29898 Other symptoms and signs involving the musculoskeletal system: Secondary | ICD-10-CM

## 2014-08-21 DIAGNOSIS — Z5189 Encounter for other specified aftercare: Secondary | ICD-10-CM | POA: Diagnosis not present

## 2014-08-21 DIAGNOSIS — M25512 Pain in left shoulder: Secondary | ICD-10-CM

## 2014-08-21 NOTE — Therapy (Signed)
Outpatient Rehabilitation [facility] [address] [place], [place], [address] Phone: [phone]   Fax:  [phone]  Physical Therapy Treatment  Patient Details  Name: Felicia Burke MRN: [id number] Date of Birth: Mar 05, 1956  Encounter Date: 08/21/2014      PT End of Session - 08/21/14 0753    Visit Number 23   Number of Visits 30   Date for PT Re-Evaluation 09/01/14   PT Start Time 0700   PT Stop Time 0750   PT Time Calculation (min) 50 min   Activity Tolerance Patient tolerated treatment well   Behavior During Therapy [facility] for tasks assessed/performed      Past Medical History  Diagnosis Date  . Hypertension   . Asthma   . Diverticulosis   . Ovarian cyst   . Unspecified hemorrhoids with other complication   . Non-alcoholic fatty liver disease   . Arthritis   . Migraine   . Depression   . Gallstones   . Varicose veins   . Peripheral vascular disease   . DM (diabetes mellitus)     prior to Gastric bypass  . GERD (gastroesophageal reflux disease)   . Basal cell carcinoma     x 5  . Anemia     Past Surgical History  Procedure Laterality Date  . Cystocele repair    . Vaginal hysterectomy    . Cholecystectomy    . Bariatric surgery    . Bunionectomy      bilateral Left side redo  . Endovenous ablation saphenous vein w/ laser  01-22-2012    right greater saphenous vein and stab phlebectomy >20 incisions right leg    [name]  . Endovenous ablation saphenous vein w/ laser  02-26-2012    left greater saphenous vein and stab phlebectomy >20 left leg  . Carpal tunnel release Bilateral   . Shoulder arthroscopy with subacromial decompression, rotator cuff repair and bicep tendon repair Left 05/08/2014    Procedure: LEFT SHOULDER ARTHROSCOPY WITH SUBACROMIAL DECOMPRESSION,OPEN ROTATOR CUFF REPAIR  OPEN BICEP TENODESIS,  OPEN SUBSCAPULAR REPAIR;  Surgeon: [name], MD;  Location: [place];  Service: Orthopedics;  Laterality: Left;    There  were no vitals taken for this visit.  Visit Diagnosis:  Pain in joint, shoulder region, left  Stiffness of glenohumeral joint, left  Weakness of left upper extremity      Subjective Assessment - 08/21/14 0715    Symptoms Mild soreness from using arm this weekend. Taking more dishes off shelf, carry laundry basket, picked up a child (24 pounds)   Currently in Pain? Yes   Pain Score 3    Pain Location Shoulder   Pain Orientation Left   Pain Descriptors / Indicators Aching   Pain Type Surgical pain   Pain Onset More than a month ago   Pain Frequency Intermittent   Aggravating Factors  using arm   Pain Relieving Factors Meds and rst and cold   Effect of Pain on Daily Activities limited with home activity   Multiple Pain Sites No          [facility] PT Assessment - 08/21/14 0001    AROM   Left Shoulder Flexion 130 Degrees   Left Shoulder ABduction 115 Degrees   Left Shoulder Internal Rotation 60 Degrees   Left Shoulder External Rotation 70 Degrees   Left Shoulder Horizontal ABduction 116 Degrees   Left Shoulder Horizontal ADduction 110 Degrees          [facility] Adult  PT Treatment/Exercise - 08/21/14 0717    Shoulder Exercises: Seated   Other Seated Exercises UBE 5 minutes L1   Other Seated Exercises Elbow curls 4 pounds x 20   Shoulder Exercises: Standing   Protraction Strengthening;Left;12 reps  Punching   External Rotation Strengthening;Left;12 reps   Theraband Level (Shoulder External Rotation) Level 1 (Yellow)   Internal Rotation Strengthening;Left;12 reps   Theraband Level (Shoulder Internal Rotation) Level 1 (Yellow)   ABduction Strengthening;Left;12 reps   Theraband Level (Shoulder ABduction) Level 1 (Yellow)   Extension Strengthening;Left;12 reps   Theraband Level (Shoulder Extension) Level 1 (Yellow)   Other Standing Exercises Wall ladder x 19 with medium stretch   Other Standing Exercises Pushups on orange ball on counter x15 reps   Shoulder Exercises: Pulleys    Flexion 3 minutes   ABduction 3 minutes   Manual Therapy   Manual Therapy --  Kineseotape 2 Y's one prox to disatl ant scar and on shlder          PT Education - 08/21/14 0753    Education provided Yes   Education Details tape   Person(s) Educated Patient   Methods Explanation;Verbal cues   Comprehension Verbalized understanding              Plan - 08/21/14 0754    Clinical Impression Statement Her range is not worse and toleraed band exercise. Functionally she is doing more without significant increase in pain that is manageable   Pt will benefit from skilled therapeutic intervention in order to improve on the following deficits Decreased range of motion;Decreased strength;Impaired UE functional use   Rehab Potential Good   PT Frequency 2x / week   PT Duration 3 weeks   PT Treatment/Interventions Patient/family education;Therapeutic exercise;Manual techniques;Passive range of motion   PT Next Visit Plan Add to HEP if appropriate   PT Home Exercise Plan add active exercise with LT arm  if pain continued improved   Consulted and Agree with Plan of Care Patient                               Problem List Patient Active Problem List   Diagnosis Date Noted  . Varicose veins of lower extremities with other complications 93/81/0175  . Chronic venous insufficiency 08/08/2011    Darrel Hoover PT 08/21/2014, 7:56 AM

## 2014-08-21 NOTE — Patient Instructions (Signed)
PT asked to remove tape if irritating  But if beneficial she should keep on 3 days . OK to shower with tape.

## 2014-08-25 ENCOUNTER — Ambulatory Visit: Payer: 59

## 2014-08-25 DIAGNOSIS — R29898 Other symptoms and signs involving the musculoskeletal system: Secondary | ICD-10-CM

## 2014-08-25 DIAGNOSIS — Z5189 Encounter for other specified aftercare: Secondary | ICD-10-CM | POA: Diagnosis not present

## 2014-08-25 DIAGNOSIS — M25612 Stiffness of left shoulder, not elsewhere classified: Secondary | ICD-10-CM

## 2014-08-25 DIAGNOSIS — M25512 Pain in left shoulder: Secondary | ICD-10-CM

## 2014-08-25 NOTE — Therapy (Signed)
[place], [place], [address] Phone: [phone]   Fax:  [phone]  Physical Therapy Treatment  Patient Details  Name: Felicia Burke MRN: [id number] Date of Birth: 05-25-1956  Encounter Date: 08/25/2014      PT End of Session - 08/25/14 0755    Visit Number 24   Number of Visits 30   Date for PT Re-Evaluation 09/01/14   PT Start Time 0656   PT Stop Time 0745   PT Time Calculation (min) 49 min   Activity Tolerance Patient tolerated treatment well   Behavior During Therapy [facility] for tasks assessed/performed      Past Medical History  Diagnosis Date  . Hypertension   . Asthma   . Diverticulosis   . Ovarian cyst   . Unspecified hemorrhoids with other complication   . Non-alcoholic fatty liver disease   . Arthritis   . Migraine   . Depression   . Gallstones   . Varicose veins   . Peripheral vascular disease   . DM (diabetes mellitus)     prior to Gastric bypass  . GERD (gastroesophageal reflux disease)   . Basal cell carcinoma     x 5  . Anemia     Past Surgical History  Procedure Laterality Date  . Cystocele repair    . Vaginal hysterectomy    . Cholecystectomy    . Bariatric surgery    . Bunionectomy      bilateral Left side redo  . Endovenous ablation saphenous vein w/ laser  01-22-2012    right greater saphenous vein and stab phlebectomy >20 incisions right leg    [name]  . Endovenous ablation saphenous vein w/ laser  02-26-2012    left greater saphenous vein and stab phlebectomy >20 left leg  . Carpal tunnel release Bilateral   . Shoulder arthroscopy with subacromial decompression, rotator cuff repair and bicep tendon repair Left 05/08/2014    Procedure: LEFT SHOULDER ARTHROSCOPY WITH SUBACROMIAL DECOMPRESSION,OPEN ROTATOR CUFF REPAIR  OPEN BICEP TENODESIS,  OPEN SUBSCAPULAR REPAIR;  Surgeon: [name], MD;  Location: [place];  Service: Orthopedics;  Laterality: Left;     There were no vitals taken for this visit.  Visit Diagnosis:  Pain in joint, shoulder region, left  Stiffness of glenohumeral joint, left  Weakness of left upper extremity      Subjective Assessment - 08/25/14 0700    Symptoms Shoulder tender and sore all week . Not good week.    Currently in Pain? Yes   Pain Score 3    Pain Location Shoulder   Pain Orientation Left   Pain Descriptors / Indicators Aching   Pain Type Surgical pain   Pain Onset More than a month ago   Pain Frequency Constant   Aggravating Factors  using arm   Pain Relieving Factors Meds and rest and cold   Effect of Pain on Daily Activities limited with home activiyt   Multiple Pain Sites No                    [facility] Adult PT Treatment/Exercise - 08/25/14 0656    Neck Exercises: Machines for Strengthening   UBE (Upper Arm Bike) 6 minutes L2 , 3 min fore 3 min back   Shoulder Exercises: Standing   External Rotation Strengthening;Left;12 reps   Theraband Level (Shoulder External Rotation) Level 1 (Yellow)   Internal Rotation Strengthening;Left;12 reps   Theraband Level (Shoulder Internal Rotation)  Level 2 (Red)   ABduction Strengthening;Left;12 reps   Theraband Level (Shoulder ABduction) Level 2 (Red)   Extension Strengthening;Left;12 reps   Theraband Level (Shoulder Extension) Level 2 (Red)   Other Standing Exercises pushing sled 50 feet x2   Shoulder Exercises: Pulleys   Flexion 3 minutes   ABduction 3 minutes   Manual Therapy   Manual Therapy Massage;Myofascial release  Lt shoulder   Joint Mobilization GR 2-3 distraction and glides posterior and inferior with rotation for ext rotation and strerching foir rotation /flexion and abduciton                PT Education - 08/25/14 0755    Education provided No                    Plan - 08/25/14 0756    Clinical Impression Statement PAin limited activity tis week adn ext rotation weaknees limits lifting. She is  progressing toward LTG   Pt will benefit from skilled therapeutic intervention in order to improve on the following deficits Decreased range of motion;Decreased strength;Impaired UE functional use   Rehab Potential Good   PT Frequency 2x / week   PT Duration 3 weeks   PT Treatment/Interventions Patient/family education;Therapeutic exercise;Manual techniques;Passive range of motion   PT Next Visit Plan Add to HEP if appropriate   PT Home Exercise Plan add active exercise with LT arm  if pain continued improved   Consulted and Agree with Plan of Care Patient        Problem List Patient Active Problem List   Diagnosis Date Noted  . Varicose veins of lower extremities with other complications 60/06/9322  . Chronic venous insufficiency 08/08/2011    Darrel Hoover  PT  08/25/2014, 7:58 AM  [facility] [address] [place], [place], [address] Phone: [phone]   Fax:  [phone]

## 2014-08-28 ENCOUNTER — Ambulatory Visit: Payer: 59

## 2014-08-28 DIAGNOSIS — R29898 Other symptoms and signs involving the musculoskeletal system: Secondary | ICD-10-CM

## 2014-08-28 DIAGNOSIS — Z5189 Encounter for other specified aftercare: Secondary | ICD-10-CM | POA: Diagnosis not present

## 2014-08-28 DIAGNOSIS — M25612 Stiffness of left shoulder, not elsewhere classified: Secondary | ICD-10-CM

## 2014-08-28 DIAGNOSIS — M25512 Pain in left shoulder: Secondary | ICD-10-CM

## 2014-08-28 NOTE — Therapy (Signed)
[place], [place], [address] Phone: [phone]   Fax:  [phone]  Physical Therapy Treatment  Patient Details  Name: Felicia Burke MRN: [id number] Date of Birth: 1955/10/27  Encounter Date: 08/28/2014      PT End of Session - 08/28/14 0758    Visit Number 25   Number of Visits 30   Date for PT Re-Evaluation 09/01/14   PT Start Time 0700   PT Stop Time 0745   PT Time Calculation (min) 45 min   Activity Tolerance Patient tolerated treatment well   Behavior During Therapy [facility] for tasks assessed/performed      Past Medical History  Diagnosis Date  . Hypertension   . Asthma   . Diverticulosis   . Ovarian cyst   . Unspecified hemorrhoids with other complication   . Non-alcoholic fatty liver disease   . Arthritis   . Migraine   . Depression   . Gallstones   . Varicose veins   . Peripheral vascular disease   . DM (diabetes mellitus)     prior to Gastric bypass  . GERD (gastroesophageal reflux disease)   . Basal cell carcinoma     x 5  . Anemia     Past Surgical History  Procedure Laterality Date  . Cystocele repair    . Vaginal hysterectomy    . Cholecystectomy    . Bariatric surgery    . Bunionectomy      bilateral Left side redo  . Endovenous ablation saphenous vein w/ laser  01-22-2012    right greater saphenous vein and stab phlebectomy >20 incisions right leg    [name]  . Endovenous ablation saphenous vein w/ laser  02-26-2012    left greater saphenous vein and stab phlebectomy >20 left leg  . Carpal tunnel release Bilateral   . Shoulder arthroscopy with subacromial decompression, rotator cuff repair and bicep tendon repair Left 05/08/2014    Procedure: LEFT SHOULDER ARTHROSCOPY WITH SUBACROMIAL DECOMPRESSION,OPEN ROTATOR CUFF REPAIR  OPEN BICEP TENODESIS,  OPEN SUBSCAPULAR REPAIR;  Surgeon: [name], MD;  Location: [place];  Service: Orthopedics;  Laterality: Left;     There were no vitals taken for this visit.  Visit Diagnosis:  Pain in joint, shoulder region, left  Stiffness of glenohumeral joint, left  Weakness of left upper extremity      Subjective Assessment - 08/28/14 0712    Symptoms Shouder OK today wiuth anterior shoulder stiffness and soreness. OK Firday and more pain Saturday but [facility] today. Clicking and popping still    Limitations Reading   Patient Stated Goals Return to normal activity/use of LT arm without pain.    Currently in Pain? Yes   Pain Score 2    Pain Location Shoulder   Pain Orientation Left   Pain Descriptors / Indicators Aching   Pain Type Surgical pain   Pain Onset More than a month ago   Pain Frequency Intermittent  No pain at rest.    Aggravating Factors  Using arm   Pain Relieving Factors Meds , cold, rest   Effect of Pain on Daily Activities Modified use of arm but this is improving and she is doing activity as needed at home.    Multiple Pain Sites Yes                    [place] Adult PT Treatment/Exercise - 08/28/14 0716    Neck Exercises: Machines for Strengthening  UBE (Upper Arm Bike) 8 min 4 fore  4 back L2   Shoulder Exercises: Standing   External Rotation Strengthening   Theraband Level (Shoulder External Rotation) Level 1 (Yellow)   Internal Rotation Strengthening;Left;12 reps   Theraband Level (Shoulder Internal Rotation) Level 2 (Red)   ABduction Strengthening;Left;12 reps   Theraband Level (Shoulder ABduction) Level 2 (Red)   Extension Strengthening;Left;12 reps   Theraband Level (Shoulder Extension) Level 2 (Red)   Shoulder Exercises: Pulleys   Flexion 3 minutes   ABduction 3 minutes   Shoulder Exercises: Therapy Ball   Other Therapy Ball Exercises Standing pushup  of  orange ball at counter   Manual Therapy   Joint Mobilization Gr 3-4 mobs with rotation inferior and psoterior glides with distraction   Manual Therapy --  passive range emphaiss ER and abduction                   PT Short Term Goals - 07/25/14 0749    PT SHORT TERM GOAL #3   Title Passive abduction to 90 degrees   Status Achieved           PT Long Term Goals - 08/02/14 0748    PT LONG TERM GOAL #1   Title Demonstrrate understanding of use of cold for pain   Status Achieved   PT LONG TERM GOAL #2   Title Independent with advanced HEP   Baseline Pain ha slowed progress with Exercise   Time 4   Period Weeks   Status On-going   PT LONG TERM GOAL #3   Title Pain decreased 75% with normla activity at work and home   Baseline She is much improved with minimal exercise but we will be cautious with pushing srengthening   Time 4   Status On-going   PT LONG TERM GOAL #4   Title Independent selfcare   Status Achieved   PT LONG TERM GOAL #5   Title return to light to moderate activity at home with minmal pain   Baseline She is doing light activity but not moderate activity   Time 4   Status On-going               Plan - 08/28/14 0758    Clinical Impression Statement Some stiffness at end range with pain but 90% WNL. She is progreaaing with use of arm but pain still limits tolerance to activity   Pt will benefit from skilled therapeutic intervention in order to improve on the following deficits Decreased range of motion;Decreased strength;Impaired UE functional use   Rehab Potential Good   PT Frequency 2x / week   PT Duration 2 weeks   PT Treatment/Interventions Patient/family education;Therapeutic exercise;Manual techniques;Passive range of motion   PT Next Visit Plan Add to HEP if appropriate   PT Home Exercise Plan add active exercise with LT arm  if pain continued improved   Consulted and Agree with Plan of Care Patient        Problem List Patient Active Problem List   Diagnosis Date Noted  . Varicose veins of lower extremities with other complications 26/94/8546  . Chronic venous insufficiency 08/08/2011    Darrel Hoover PT 08/28/2014,  8:01 AM  [facility] [address] [place], [place], [address] Phone: [phone]   Fax:  [phone]

## 2014-08-30 ENCOUNTER — Ambulatory Visit: Payer: 59

## 2014-08-30 DIAGNOSIS — Z5189 Encounter for other specified aftercare: Secondary | ICD-10-CM | POA: Diagnosis not present

## 2014-08-30 DIAGNOSIS — M25512 Pain in left shoulder: Secondary | ICD-10-CM

## 2014-08-30 DIAGNOSIS — M25612 Stiffness of left shoulder, not elsewhere classified: Secondary | ICD-10-CM

## 2014-08-30 DIAGNOSIS — R29898 Other symptoms and signs involving the musculoskeletal system: Secondary | ICD-10-CM

## 2014-08-30 NOTE — Therapy (Signed)
[place], [place], [address] Phone: [phone]   Fax:  [phone]  Physical Therapy Treatment  Patient Details  Name: Felicia Burke MRN: [id number] Date of Birth: August 24, 1956  Encounter Date: 08/30/2014      PT End of Session - 08/30/14 0706    Visit Number 26   Number of Visits 30   Date for PT Re-Evaluation 09/01/14   PT Start Time 0700   PT Stop Time 0745   PT Time Calculation (min) 45 min   Activity Tolerance Patient tolerated treatment well   Behavior During Therapy [facility] for tasks assessed/performed      Past Medical History  Diagnosis Date  . Hypertension   . Asthma   . Diverticulosis   . Ovarian cyst   . Unspecified hemorrhoids with other complication   . Non-alcoholic fatty liver disease   . Arthritis   . Migraine   . Depression   . Gallstones   . Varicose veins   . Peripheral vascular disease   . DM (diabetes mellitus)     prior to Gastric bypass  . GERD (gastroesophageal reflux disease)   . Basal cell carcinoma     x 5  . Anemia     Past Surgical History  Procedure Laterality Date  . Cystocele repair    . Vaginal hysterectomy    . Cholecystectomy    . Bariatric surgery    . Bunionectomy      bilateral Left side redo  . Endovenous ablation saphenous vein w/ laser  01-22-2012    right greater saphenous vein and stab phlebectomy >20 incisions right leg    [name]  . Endovenous ablation saphenous vein w/ laser  02-26-2012    left greater saphenous vein and stab phlebectomy >20 left leg  . Carpal tunnel release Bilateral   . Shoulder arthroscopy with subacromial decompression, rotator cuff repair and bicep tendon repair Left 05/08/2014    Procedure: LEFT SHOULDER ARTHROSCOPY WITH SUBACROMIAL DECOMPRESSION,OPEN ROTATOR CUFF REPAIR  OPEN BICEP TENODESIS,  OPEN SUBSCAPULAR REPAIR;  Surgeon: [name], MD;  Location: [place];  Service: Orthopedics;  Laterality: Left;     There were no vitals taken for this visit.  Visit Diagnosis:  Pain in joint, shoulder region, left - Plan: PT plan of care cert/re-cert  Stiffness of glenohumeral joint, left - Plan: PT plan of care cert/re-cert  Weakness of left upper extremity - Plan: PT plan of care cert/re-cert      Subjective Assessment - 08/30/14 0707    Symptoms Doing Ok to start today anterior LT shoulder pain 1-2/10. Not hear as sore as expected on monday. Using LT arm at work. 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 Yes   Pain Score 2   " I expect this to be my new normal"   Pain Location Shoulder   Pain Orientation Left   Pain Descriptors / Indicators Aching   Pain Type Surgical pain   Pain Frequency Intermittent   Multiple Pain Sites No          [facility] PT Assessment - 08/30/14 0711    AROM   Left Shoulder Flexion 130 Degrees   Left Shoulder ABduction 125 Degrees   Left Shoulder Internal Rotation 55 Degrees   Left Shoulder External Rotation 58 Degrees   Left Shoulder Horizontal ABduction 116 Degrees   Left Shoulder Horizontal ADduction 110 Degrees   Strength  Left Shoulder Flexion 4/5   Left Shoulder Extension 4+/5   Left Shoulder ABduction 4/5   Left Shoulder Internal Rotation 4/5   Left Shoulder External Rotation 4-/5   Left Shoulder Horizontal ABduction 4/5   Left Shoulder Horizontal ADduction 4+/5                  [facility] Adult PT Treatment/Exercise - 08/30/14 0720    Neck Exercises: Machines for Strengthening   UBE (Upper Arm Bike) 8 min 4 fore  4 back L2   Shoulder Exercises: Seated   Internal Rotation Left  stretching behind back 30 sec x3 reps   Shoulder Exercises: Sidelying   External Rotation Strengthening;12 reps;Left   External Rotation Weight (lbs) 2   Shoulder Exercises: Stretch   Corner Stretch 3 reps;30 seconds   Other Shoulder Stretches LT arm wall slide stretch in corner 3x30 sec   Cross Chest Stretch Limitations  2x30 sec                PT Education - 08/30/14 0734    Education provided Yes   Education Details stretching HEP   Person(s) Educated Patient   Methods Explanation;Demonstration;Tactile cues;Verbal cues;Handout   Comprehension Verbalized understanding          PT Short Term Goals - 07/25/14 0749    PT SHORT TERM GOAL #3   Title Passive abduction to 90 degrees   Status Achieved           PT Long Term Goals - 08/30/14 0751    PT LONG TERM GOAL #1   Title Demonstrrate understanding of use of cold for pain   Status Achieved   PT LONG TERM GOAL #2   Title Independent with advanced HEP   Baseline She need progression to improve strenght and range   Time 4   Period Weeks   Status On-going   PT LONG TERM GOAL #3   Title Pain decreased 75% with normla activity at work and home   Status Achieved   PT LONG TERM GOAL #4   Title Independent selfcare   Status Achieved   PT LONG TERM GOAL #5   Title return to light to moderate activity at home with minmal pain   Status Achieved   Additional Long Term Goals   Additional Long Term Goals Yes   PT LONG TERM GOAL #6   Title Return to moderate home tasks with 2/10 max pain   Time 4   Period Weeks   Status New   PT LONG TERM GOAL #7   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   Time 4   Period Weeks   Status New               Plan - 08/30/14 0750    Clinical Impression Statement Range slightly down so added some stretches for HEP   Pt will benefit from skilled therapeutic intervention in order to improve on the following deficits Decreased range of motion;Decreased strength;Impaired UE functional use   Rehab Potential Good   PT Frequency 2x / week   PT Duration 2 weeks   PT Treatment/Interventions Patient/family education;Therapeutic exercise;Manual techniques;Passive range of motion   PT Next Visit Plan review stetcing and continue strenght   PT Home Exercise Plan contiue stretch and strength    Consulted and Agree with Plan of Care Patient        Problem List Patient Active Problem List   Diagnosis Date Noted  .  Varicose veins of lower extremities with other complications 11/91/4782  . Chronic venous insufficiency 08/08/2011    Darrel Hoover PT 08/30/2014, 7:56 AM  [facility] [address] [place], [place], [address] Phone: [phone]   Fax:  [phone]  Physician: Esmond Plants MD  Certification Start Date: 44/01/02 Certification End Date: 09/30/14  Physician Documentation Your signature is required to indicate approval of the treatment plan as stated above.  Please sign and either send electronically or make a copy of this report for your files and return this physician signed original.  Please mark one 1.__approve of plan   2. ___approve of plan with the followingconditions. ____________________________________________________________________________________________________________________________________________   ______________________                                                       _____________________ Physician Signature                                                                     Date    Faxed to MD for signature

## 2014-08-30 NOTE — Patient Instructions (Addendum)
.  Horizontal Adduction   Stretch shoulder inward: 1. Pull arm with other hand until elbow comes to midline. 2. Extend arms in front at shoulder height, crossed at elbow, then clasp hands. Hold _30___ seconds. Repeat, switching arms. Repeat _2-3___ times. Do __2__ sessions per day.   Copyright  VHI. All rights reserved.   Also added corner stretches and wall /corner Lt arm stretch in corner. Also reaching behind back. All 2x/day 2-3 reps and 30 seconds. External Rotation (Side-Lying)   Lie on left side, holding __ pound ball in top hand, elbow bent 90, towel roll between arm and body. Keeping elbow bent at side, rotate forearm upward. Repeat 10-15__ times. Repeat with other hand for set. Rest __ seconds after set. Do1-2 __ sets per session. 2x/day. Hold 5 seconds. Keep elbow at 90 degrees  Copyright  VHI. All rights reserved.

## 2014-09-04 ENCOUNTER — Ambulatory Visit: Payer: 59

## 2014-09-04 DIAGNOSIS — M25512 Pain in left shoulder: Secondary | ICD-10-CM

## 2014-09-04 DIAGNOSIS — M25612 Stiffness of left shoulder, not elsewhere classified: Secondary | ICD-10-CM

## 2014-09-04 DIAGNOSIS — R29898 Other symptoms and signs involving the musculoskeletal system: Secondary | ICD-10-CM

## 2014-09-04 DIAGNOSIS — Z5189 Encounter for other specified aftercare: Secondary | ICD-10-CM | POA: Diagnosis not present

## 2014-09-04 NOTE — Therapy (Addendum)
[place], [place], [address] Phone: [phone]   Fax:  [phone]  Physical Therapy Treatment  Patient Details  Name: Felicia Burke MRN: [id number] Date of Birth: 1955-11-11  Encounter Date: 09/04/2014      PT End of Session - 09/04/14 0754    Visit Number 27   Number of Visits 30   Date for PT Re-Evaluation 09/15/14   PT Start Time 0703   PT Stop Time 0753   PT Time Calculation (min) 50 min   Activity Tolerance Patient tolerated treatment well   Behavior During Therapy [facility] for tasks assessed/performed      Past Medical History  Diagnosis Date  . Hypertension   . Asthma   . Diverticulosis   . Ovarian cyst   . Unspecified hemorrhoids with other complication   . Non-alcoholic fatty liver disease   . Arthritis   . Migraine   . Depression   . Gallstones   . Varicose veins   . Peripheral vascular disease   . DM (diabetes mellitus)     prior to Gastric bypass  . GERD (gastroesophageal reflux disease)   . Basal cell carcinoma     x 5  . Anemia     Past Surgical History  Procedure Laterality Date  . Cystocele repair    . Vaginal hysterectomy    . Cholecystectomy    . Bariatric surgery    . Bunionectomy      bilateral Left side redo  . Endovenous ablation saphenous vein w/ laser  01-22-2012    right greater saphenous vein and stab phlebectomy >20 incisions right leg    [name]  . Endovenous ablation saphenous vein w/ laser  02-26-2012    left greater saphenous vein and stab phlebectomy >20 left leg  . Carpal tunnel release Bilateral   . Shoulder arthroscopy with subacromial decompression, rotator cuff repair and bicep tendon repair Left 05/08/2014    Procedure: LEFT SHOULDER ARTHROSCOPY WITH SUBACROMIAL DECOMPRESSION,OPEN ROTATOR CUFF REPAIR  OPEN BICEP TENODESIS,  OPEN SUBSCAPULAR REPAIR;  Surgeon: [name], MD;  Location: [place];  Service: Orthopedics;  Laterality: Left;     There were no vitals taken for this visit.  Visit Diagnosis:  Pain in joint, shoulder region, left  Stiffness of glenohumeral joint, left  Weakness of left upper extremity      Subjective Assessment - 09/04/14 0717    Symptoms No pain at rest she has pain with reaching and litfting and feels popping. Did fine with lifiting pots except large pot. Im 75-80% of where I was prior to surgery       Currently in Pain? Yes   Pain Score 2    Pain Location Shoulder   Pain Orientation Left   Pain Descriptors / Indicators Aching   Pain Type Surgical pain   Pain Onset More than a month ago   Pain Frequency Intermittent   Aggravating Factors  Reaching and using arm   Pain Relieving Factors cold andmeds   Multiple Pain Sites No          [facility] PT Assessment - 09/04/14 0723    AROM   Left Shoulder Flexion 120 Degrees   Left Shoulder ABduction 120 Degrees   Left Shoulder Internal Rotation 45 Degrees   Left Shoulder External Rotation 50 Degrees   Left Shoulder Horizontal ABduction 112 Degrees   Left Shoulder Horizontal ADduction 111 Degrees   PROM   Left Shoulder Flexion  137 Degrees   Left Shoulder ABduction 140 Degrees   Left Shoulder Internal Rotation 70 Degrees   Left Shoulder External Rotation 70 Degrees   Strength   Left Shoulder Flexion 4/5   Left Shoulder Extension 5/5   Left Shoulder ABduction 4/5   Left Shoulder Internal Rotation --  4+/5   Left Shoulder External Rotation 4-/5   Left Shoulder Horizontal ABduction 4/5   Left Shoulder Horizontal ADduction 4+/5                  [facility] Adult PT Treatment/Exercise - 09/04/14 0733    Exercises   Exercises --  pushed 300 pound in [facility] without incr pain   Neck Exercises: Machines for Strengthening   UBE (Upper Arm Bike) 8 min 4 fore  4 back L2   Shoulder Exercises: Supine   Protraction Strengthening;Left;20 reps   Protraction Weight (lbs) 4   Horizontal ABduction Strengthening;Left;15 reps   Horizontal  ABduction Weight (lbs) 3   External Rotation Strengthening;Left;15 reps   External Rotation Weight (lbs) 4 2 sets of 10   Internal Rotation Strengthening;Left;15 reps   Internal Rotation Weight (lbs) 4 2sets 10   Shoulder Exercises: Pulleys   Flexion --  4 min                  PT Short Term Goals - 07/25/14 0749    PT SHORT TERM GOAL #3   Title Passive abduction to 90 degrees   Status Achieved           PT Long Term Goals - 09/04/14 0757    PT LONG TERM GOAL #2   Title Independent with advanced HEP   Status On-going   PT LONG TERM GOAL #3   Title Pain decreased 75% with normal activity at work and home   Status Achieved   PT LONG TERM GOAL #4   Title Independent selfcare   Status Achieved   PT LONG TERM GOAL #5   Title return to light to moderate activity at home with minmal pain   Status Achieved   PT LONG TERM GOAL #6   Title Return to moderate home tasks with 2/10 max pain   Status On-going   PT LONG TERM GOAL #7   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 Shecontinues with weakness. She may benenfit with 4 more weeks of strengthening.    Pt will benefit from skilled therapeutic intervention in order to improve on the following deficits Decreased range of motion;Decreased strength;Impaired UE functional use   Rehab Potential Good   PT Frequency 2x / week   more scheduled visits and extend if MD wishes   PT Duration 4 weeks   PT Treatment/Interventions Patient/family education;Therapeutic exercise;Manual techniques;Passive range of motion   PT Next Visit Plan review stetcing and continue strength   PT Home Exercise Plan contiue stretch and strength        Problem List Patient Active Problem List   Diagnosis Date Noted  . Varicose veins of lower extremities with other complications  65/99/3570  . Chronic venous insufficiency 08/08/2011    Darrel Hoover PT 09/04/2014, 8:00 AM  [facility] [address] [place], [place], [address] Phone: [phone]   Fax:  [phone]     PHYSICAL THERAPY DISCHARGE SUMMARY  Visits from Start of Care: 27  Current functional level related to goals / functional outcomes: See above   Remaining deficits: See above   Education / Equipment: HEP Plan: Patient agrees to discharge.  Patient goals were partially met. Patient is being discharged due to                                                     ?????   She did not return after MD visit Darrel Hoover, PT    05/15/15      4:15 PM

## 2014-09-07 ENCOUNTER — Ambulatory Visit: Payer: 59

## 2014-09-11 ENCOUNTER — Ambulatory Visit: Payer: 59

## 2014-09-11 ENCOUNTER — Encounter: Payer: Self-pay | Admitting: Family Medicine

## 2014-09-11 NOTE — Therapy (Signed)
[place], [place], [address] Phone: [phone]   Fax:  [phone]  Patient Details  Name: Felicia Burke MRN: [id number] Date of Birth: Dec 07, 1955  Encounter Date: 09/11/2014  1007       PT SHORT TERM GOAL #1    Title  Independent initial HEP    Status  Achieved    PT SHORT TERM GOAL #2    Title  Passive flexion to 100 degrees    Status  Achieved    PT SHORT TERM GOAL #3    Title  Passive abduction to 90 degrees    Status  Achieved    PT LONG TERM GOAL #2    Title  Independent with advanced HEP    Status  On-going    PT LONG TERM GOAL #3    Title  Pain decreased 75% with normal activity at work and home    Status  Achieved    PT LONG TERM GOAL #4    Title  Independent selfcare    Status  Achieved    PT LONG TERM GOAL #5    Title  return to light to moderate activity at home with minmal pain    Status  Achieved          PHYSICAL THERAPY DISCHARGE SUMMARY  Visits from Start of Care: 26  Current functional level related to goals / functional outcomes: Limited with heavier home and work tasks   Remaining deficits: Contiued weakness and decr range LT shoulder   Education / Equipment: HEP for range and strength Plan: Patient agrees to discharge.  Patient goals were partially met. Patient is being discharged due to the physician's request.  ?????      Noralee Stain MPT  09/11/2014, 10:12 AM  [place], [place], [address] Phone: [phone]   Fax:  [phone]

## 2014-09-14 ENCOUNTER — Ambulatory Visit: Payer: 59

## 2015-04-20 ENCOUNTER — Other Ambulatory Visit: Payer: Self-pay | Admitting: Surgical

## 2015-04-25 ENCOUNTER — Encounter (HOSPITAL_COMMUNITY)
Admission: RE | Admit: 2015-04-25 | Discharge: 2015-04-25 | Disposition: A | Discharge status: Home / Self Care | Payer: 59 | Source: Ambulatory Visit | Attending: Orthopedic Surgery | Admitting: Orthopedic Surgery

## 2015-04-25 ENCOUNTER — Encounter (HOSPITAL_COMMUNITY): Payer: Self-pay

## 2015-04-25 DIAGNOSIS — M19012 Primary osteoarthritis, left shoulder: Secondary | ICD-10-CM | POA: Insufficient documentation

## 2015-04-25 DIAGNOSIS — Z01812 Encounter for preprocedural laboratory examination: Secondary | ICD-10-CM | POA: Insufficient documentation

## 2015-04-25 HISTORY — DX: Myoneural disorder, unspecified: G70.9

## 2015-04-25 HISTORY — DX: Personal history of other specified conditions: Z87.898

## 2015-04-25 LAB — SURGICAL PCR SCREEN
MRSA, PCR: NEGATIVE
STAPHYLOCOCCUS AUREUS: NEGATIVE

## 2015-04-25 LAB — CBC WITH DIFFERENTIAL/PLATELET
Basophils Absolute: 0 10*3/uL (ref 0.0–0.1)
Basophils Relative: 0 % (ref 0–1)
Eosinophils Absolute: 0.3 10*3/uL (ref 0.0–0.7)
Eosinophils Relative: 6 % — ABNORMAL HIGH (ref 0–5)
HCT: 31 % — ABNORMAL LOW (ref 36.0–46.0)
Hemoglobin: 10 g/dL — ABNORMAL LOW (ref 12.0–15.0)
Lymphocytes Relative: 37 % (ref 12–46)
Lymphs Abs: 2.2 10*3/uL (ref 0.7–4.0)
MCH: 29 pg (ref 26.0–34.0)
MCHC: 32.3 g/dL (ref 30.0–36.0)
MCV: 89.9 fL (ref 78.0–100.0)
Monocytes Absolute: 0.4 10*3/uL (ref 0.1–1.0)
Monocytes Relative: 7 % (ref 3–12)
Neutro Abs: 3 10*3/uL (ref 1.7–7.7)
Neutrophils Relative %: 50 % (ref 43–77)
Platelets: 235 10*3/uL (ref 150–400)
RBC: 3.45 MIL/uL — ABNORMAL LOW (ref 3.87–5.11)
RDW: 13.7 % (ref 11.5–15.5)
WBC: 6 10*3/uL (ref 4.0–10.5)

## 2015-04-25 LAB — COMPREHENSIVE METABOLIC PANEL
ALT: 19 U/L (ref 14–54)
AST: 22 U/L (ref 15–41)
Albumin: 4 g/dL (ref 3.5–5.0)
Alkaline Phosphatase: 72 U/L (ref 38–126)
Anion gap: 8 (ref 5–15)
BUN: 16 mg/dL (ref 6–20)
CO2: 31 mmol/L (ref 22–32)
Calcium: 9.3 mg/dL (ref 8.9–10.3)
Chloride: 96 mmol/L — ABNORMAL LOW (ref 101–111)
Creatinine, Ser: 0.82 mg/dL (ref 0.44–1.00)
GFR calc Af Amer: >60 mL/min (ref >60)
GFR calc non Af Amer: >60 mL/min (ref >60)
Glucose, Bld: 99 mg/dL (ref 65–99)
Potassium: 3.5 mmol/L (ref 3.5–5.1)
Sodium: 135 mmol/L (ref 135–145)
Total Bilirubin: 0.3 mg/dL (ref 0.3–1.2)
Total Protein: 7.5 g/dL (ref 6.5–8.1)

## 2015-04-25 LAB — PROTIME-INR
INR: 1.08 (ref 0.00–1.49)
Prothrombin Time: 14.2 seconds (ref 11.6–15.2)

## 2015-04-25 NOTE — Pre-Procedure Instructions (Addendum)
    Felicia Burke  04/25/2015      [facility] - [place], [place] - [address] [place] [address] Phone: [phone] Fax: [place], [place] [address] Phone: [phone] Fax: [place], [place] [address] Phone: [phone] Fax: [phone]    Your procedure is scheduled on 05-04-2015  Friday   Report to [facility] Admitting at 5:30 A.M.     Call this number if you have problems the morning of surgery:  [phone]   Remember:  Do not eat food or drink liquids after midnight.   Take these medicines the morning of surgery with A SIP OF WATER Bisoprolol(Zebeta),Bupropion(Wellbutrin),Gabapentin(Neurotin),Protonix,Tramadol   Do not wear jewelry, make-up or nail polish.  Do not wear lotions, powders, or perfumes.  You may not wear deodorant.  Do not shave 48 hours prior to surgery.     Do not bring valuables to the hospital.  [facility] is not responsible for any belongings or valuables.  Contacts, dentures or bridgework may not be worn into surgery.  Leave your suitcase in the car.  After surgery it may be brought to your room.  For patients admitted to the hospital, discharge time will be determined by your treatment team.     Special instructions:  See attached Sheet for instructions on CHG shower  Please read over the following fact sheets that you were given.Pain,cough and deep breath,Surgical Site infection       Stop all aspirin,Ibuprofen,advil Goody's powders ect . 5 days prior to surgery

## 2015-04-25 NOTE — Progress Notes (Signed)
This patient has scored at an elevated risk for obstructive sleep apnea using the STOP BANG TOOL during a pre-surgical visit. A score of 4 or greater is considered an elevated risk.

## 2015-04-26 LAB — HEMOGLOBIN A1C
Hgb A1c MFr Bld: 6.2 % — ABNORMAL HIGH (ref 4.8–5.6)
Mean Plasma Glucose: 131 mg/dL

## 2015-04-26 NOTE — H&P (Signed)
Felicia Burke is an 59 y.o. female.    Chief Complaint: left shoulder pain  HPI: Pt is a 59 y.o. female complaining of left shoulder pain for multiple years. Pain had continually increased since the beginning. X-rays in the clinic show end-stage arthritic changes of the left shoulder. Pt has tried various conservative treatments which have failed to alleviate their symptoms, including injections and therapy. Various options are discussed with the patient. Risks, benefits and expectations were discussed with the patient. Patient understand the risks, benefits and expectations and wishes to proceed with surgery.   PCP:  [name], MD  D/C Plans: Home  PMH: Past Medical History  Diagnosis Date  . Hypertension   . Asthma   . Diverticulosis   . Ovarian cyst   . Unspecified hemorrhoids with other complication   . Non-alcoholic fatty liver disease   . Arthritis   . Migraine   . Depression   . Gallstones   . Varicose veins   . Peripheral vascular disease   . DM (diabetes mellitus)     prior to Gastric bypass  . GERD (gastroesophageal reflux disease)   . Basal cell carcinoma     x 5  . Anemia   . Neuromuscular disorder     peripheral neuropathy  feet  . History of nocturia     PSH: Past Surgical History  Procedure Laterality Date  . Cystocele repair    . Vaginal hysterectomy    . Cholecystectomy    . Bariatric surgery    . Bunionectomy      bilateral Left side redo  . Endovenous ablation saphenous vein w/ laser  01-22-2012    right greater saphenous vein and stab phlebectomy >20 incisions right leg    [name]  . Endovenous ablation saphenous vein w/ laser  02-26-2012    left greater saphenous vein and stab phlebectomy >20 left leg  . Carpal tunnel release Bilateral   . Shoulder arthroscopy with subacromial decompression, rotator cuff repair and bicep tendon repair Left 05/08/2014    Procedure: LEFT SHOULDER ARTHROSCOPY WITH SUBACROMIAL DECOMPRESSION,OPEN  ROTATOR CUFF REPAIR  OPEN BICEP TENODESIS,  OPEN SUBSCAPULAR REPAIR;  Surgeon: [name], MD;  Location: [facility];  Service: Orthopedics;  Laterality: Left;    Social History:  reports that she has never smoked. She has never used smokeless tobacco. She reports that she does not drink alcohol or use illicit drugs.  Allergies:  Allergies  Allergen Reactions  . Dilaudid [Hydromorphone Hcl] Itching    PO NARCOTICS cause itching   . Flagyl [Metronidazole] Itching    Medications: No current facility-administered medications for this encounter.   Current Outpatient Prescriptions  Medication Sig Dispense Refill  . acetaminophen (TYLENOL ARTHRITIS PAIN) 650 MG CR tablet Take 650 mg by mouth 2 (two) times daily.      Marland Kitchen amitriptyline (ELAVIL) 50 MG tablet Take 50 mg by mouth at bedtime.      Marland Kitchen aspirin 81 MG tablet Take 81 mg by mouth every Monday, Wednesday, and Friday.     . bisoprolol (ZEBETA) 5 MG tablet Take 5 mg by mouth every morning.    Marland Kitchen buPROPion (WELLBUTRIN XL) 300 MG 24 hr tablet Take 300 mg by mouth daily.    . Calcium Carbonate-Vitamin D (CALTRATE 600+D) 600-400 MG-UNIT per chew tablet Chew 1 tablet by mouth daily.    . Cholecalciferol (VITAMIN D3) 2000 UNITS TABS Take 1 tablet by mouth daily.      Marland Kitchen  citalopram (CELEXA) 40 MG tablet Take 40 mg by mouth at bedtime.     . diphenhydrAMINE (BENADRYL) 50 MG tablet Take 50 mg by mouth every 8 (eight) hours as needed for itching or sleep.    Marland Kitchen docusate sodium (COLACE) 100 MG capsule Take 100 mg by mouth 2 (two) times daily as needed (for constipation).     Marland Kitchen doxazosin (CARDURA) 1 MG tablet Take 1 mg by mouth at bedtime.     . gabapentin (NEURONTIN) 300 MG capsule Take 300-600 mg by mouth 3 (three) times daily. 300 mg twice daily. 600 mg at bedtime    . hydrochlorothiazide (HYDRODIURIL) 25 MG tablet Take 25 mg by mouth every morning.    Marland Kitchen ibuprofen (ADVIL,MOTRIN) 200 MG tablet Take 800 mg by mouth every 8 (eight) hours as needed for  mild pain or moderate pain.     . pantoprazole (PROTONIX) 40 MG tablet Take 40 mg by mouth 2 (two) times daily.    . traMADol (ULTRAM) 50 MG tablet Take 50-100 mg by mouth 3 (three) times daily. 100 mg twice daily. 50 mg after dinner    . zolpidem (AMBIEN) 5 MG tablet Take 7.5 mg by mouth at bedtime as needed for sleep.       Results for orders placed or performed during the hospital encounter of 04/25/15 (from the past 48 hour(s))  Surgical pcr screen     Status: None   Collection Time: 04/25/15  9:05 AM  Result Value Ref Range   MRSA, PCR NEGATIVE NEGATIVE   Staphylococcus aureus NEGATIVE NEGATIVE    Comment:        The Xpert SA Assay (FDA approved for NASAL specimens in patients over 34 years of age), is one component of a comprehensive surveillance program.  Test performance has been validated by [facility] for patients greater than or equal to 21 year old. It is not intended to diagnose infection nor to guide or monitor treatment.   Hemoglobin A1c     Status: Abnormal   Collection Time: 04/25/15  9:12 AM  Result Value Ref Range   Hgb A1c MFr Bld 6.2 (H) 4.8 - 5.6 %    Comment: (NOTE)         Pre-diabetes: 5.7 - 6.4         Diabetes: >6.4         Glycemic control for adults with diabetes: <7.0    Mean Plasma Glucose 131 mg/dL    Comment: (NOTE) Performed At: [facility] [place], [place] [id number] [name] CB:[id number]   CBC WITH DIFFERENTIAL     Status: Abnormal   Collection Time: 04/25/15  9:12 AM  Result Value Ref Range   WBC 6.0 4.0 - 10.5 K/uL   RBC 3.45 (L) 3.87 - 5.11 MIL/uL   Hemoglobin 10.0 (L) 12.0 - 15.0 g/dL   HCT 31.0 (L) 36.0 - 46.0 %   MCV 89.9 78.0 - 100.0 fL   MCH 29.0 26.0 - 34.0 pg   MCHC 32.3 30.0 - 36.0 g/dL   RDW 13.7 11.5 - 15.5 %   Platelets 235 150 - 400 K/uL   Neutrophils Relative % 50 43 - 77 %   Neutro Abs 3.0 1.7 - 7.7 K/uL   Lymphocytes Relative 37 12 - 46 %   Lymphs Abs 2.2 0.7 - 4.0 K/uL    Monocytes Relative 7 3 - 12 %   Monocytes Absolute 0.4 0.1 - 1.0 K/uL  Eosinophils Relative 6 (H) 0 - 5 %   Eosinophils Absolute 0.3 0.0 - 0.7 K/uL   Basophils Relative 0 0 - 1 %   Basophils Absolute 0.0 0.0 - 0.1 K/uL  Comprehensive metabolic panel     Status: Abnormal   Collection Time: 04/25/15  9:12 AM  Result Value Ref Range   Sodium 135 135 - 145 mmol/L   Potassium 3.5 3.5 - 5.1 mmol/L   Chloride 96 (L) 101 - 111 mmol/L   CO2 31 22 - 32 mmol/L   Glucose, Bld 99 65 - 99 mg/dL   BUN 16 6 - 20 mg/dL   Creatinine, Ser 0.82 0.44 - 1.00 mg/dL   Calcium 9.3 8.9 - 10.3 mg/dL   Total Protein 7.5 6.5 - 8.1 g/dL   Albumin 4.0 3.5 - 5.0 g/dL   AST 22 15 - 41 U/L   ALT 19 14 - 54 U/L   Alkaline Phosphatase 72 38 - 126 U/L   Total Bilirubin 0.3 0.3 - 1.2 mg/dL   GFR calc non Af Amer >60 >60 mL/min   GFR calc Af Amer >60 >60 mL/min    Comment: (NOTE) The eGFR has been calculated using the CKD EPI equation. This calculation has not been validated in all clinical situations. eGFR's persistently <60 mL/min signify possible Chronic Kidney Disease.    Anion gap 8 5 - 15  Protime-INR     Status: None   Collection Time: 04/25/15  9:12 AM  Result Value Ref Range   Prothrombin Time 14.2 11.6 - 15.2 seconds   INR 1.08 0.00 - 1.49   No results found.  ROS: Pain with rom of the left upper extremity  Physical Exam:  Alert and oriented 59 y.o. female in no acute distress Cranial nerves 2-12 intact Cervical spine: full rom with no tenderness, nv intact distally Chest: active breath sounds bilaterally, no wheeze rhonchi or rales Heart: regular rate and rhythm, no murmur Abd: non tender non distended with active bowel sounds Hip is stable with rom  Left shoulder with moderate limitation with rom due to pain and crepitus nv intact distally Strength of ER and IR 3/5  No rashes or edema  Assessment/Plan Assessment: left shoulder rotator cuff insufficiency  Plan: Patient will undergo  a right reverse total shoulder by Dr. Veverly Fells at [facility]. Risks benefits and expectations were discussed with the patient. Patient understand risks, benefits and expectations and wishes to proceed.

## 2015-05-03 MED ORDER — CEFAZOLIN SODIUM-DEXTROSE 2-3 GM-% IV SOLR
2.0000 g | INTRAVENOUS | Status: AC
  Administered 2015-05-04: 2 g via INTRAVENOUS
  Filled 2015-05-03: qty 50

## 2015-05-03 MED ORDER — LACTATED RINGERS IV SOLN
INTRAVENOUS | Status: DC
  Administered 2015-05-04 (×2): via INTRAVENOUS

## 2015-05-03 MED ORDER — CHLORHEXIDINE GLUCONATE 4 % EX LIQD: 60.0000 mL | Freq: Once | CUTANEOUS | Status: DC

## 2015-05-03 NOTE — Anesthesia Preprocedure Evaluation (Addendum)
Anesthesia Evaluation  Patient identified by MRN, date of birth, ID band Patient awake    Reviewed: Allergy & Precautions, H&P , NPO status , Patient's Chart, lab work & pertinent test results, reviewed documented beta blocker date and time   Airway Mallampati: II   Neck ROM: Full    Dental  (+) Teeth Intact   Pulmonary asthma ,  breath sounds clear to auscultation        Cardiovascular hypertension, Rhythm:Regular Rate:Normal     Neuro/Psych  Headaches,  Neuromuscular disease    GI/Hepatic Neg liver ROS, GERD-  ,  Endo/Other  diabetes, Well Controlled, Type 2  Renal/GU negative Renal ROS     Musculoskeletal  (+) Arthritis -,   Abdominal (+) + obese,   Peds  Hematology  (+) anemia ,   Anesthesia Other Findings   Reproductive/Obstetrics                            Anesthesia Physical Anesthesia Plan  ASA: III  Anesthesia Plan: General   Post-op Pain Management: GA combined w/ Regional for post-op pain   Induction: Intravenous  Airway Management Planned:   Additional Equipment:   Intra-op Plan:   Post-operative Plan: Extubation in OR  Informed Consent: I have reviewed the patients History and Physical, chart, labs and discussed the procedure including the risks, benefits and alternatives for the proposed anesthesia with the patient or authorized representative who has indicated his/her understanding and acceptance.   Dental advisory given  Plan Discussed with: CRNA and Surgeon  Anesthesia Plan Comments:         Anesthesia Quick Evaluation

## 2015-05-04 ENCOUNTER — Ambulatory Visit (HOSPITAL_COMMUNITY): Payer: 59 | Admitting: Anesthesiology

## 2015-05-04 ENCOUNTER — Encounter (HOSPITAL_COMMUNITY)
Admission: RE | Disposition: A | Discharge status: Home Health | Payer: Self-pay | Source: Ambulatory Visit | Attending: Orthopedic Surgery

## 2015-05-04 ENCOUNTER — Encounter (HOSPITAL_COMMUNITY): Payer: Self-pay | Admitting: General Practice

## 2015-05-04 ENCOUNTER — Inpatient Hospital Stay (HOSPITAL_COMMUNITY): Payer: 59

## 2015-05-04 ENCOUNTER — Inpatient Hospital Stay (HOSPITAL_COMMUNITY)
Admission: RE | Admit: 2015-05-04 | Discharge: 2015-05-05 | DRG: 483 | Disposition: A | Discharge status: Home Health | Payer: 59 | Source: Ambulatory Visit | Attending: Orthopedic Surgery | Admitting: Orthopedic Surgery

## 2015-05-04 DIAGNOSIS — D62 Acute posthemorrhagic anemia: Secondary | ICD-10-CM | POA: Diagnosis not present

## 2015-05-04 DIAGNOSIS — K219 Gastro-esophageal reflux disease without esophagitis: Secondary | ICD-10-CM | POA: Diagnosis present

## 2015-05-04 DIAGNOSIS — M19012 Primary osteoarthritis, left shoulder: Secondary | ICD-10-CM | POA: Diagnosis present

## 2015-05-04 DIAGNOSIS — I8390 Asymptomatic varicose veins of unspecified lower extremity: Secondary | ICD-10-CM | POA: Diagnosis present

## 2015-05-04 DIAGNOSIS — Z791 Long term (current) use of non-steroidal anti-inflammatories (NSAID): Secondary | ICD-10-CM

## 2015-05-04 DIAGNOSIS — F329 Major depressive disorder, single episode, unspecified: Secondary | ICD-10-CM | POA: Diagnosis present

## 2015-05-04 DIAGNOSIS — K760 Fatty (change of) liver, not elsewhere classified: Secondary | ICD-10-CM | POA: Diagnosis present

## 2015-05-04 DIAGNOSIS — Z885 Allergy status to narcotic agent status: Secondary | ICD-10-CM | POA: Diagnosis not present

## 2015-05-04 DIAGNOSIS — M75102 Unspecified rotator cuff tear or rupture of left shoulder, not specified as traumatic: Secondary | ICD-10-CM | POA: Diagnosis present

## 2015-05-04 DIAGNOSIS — Z881 Allergy status to other antibiotic agents status: Secondary | ICD-10-CM

## 2015-05-04 DIAGNOSIS — E119 Type 2 diabetes mellitus without complications: Secondary | ICD-10-CM | POA: Diagnosis present

## 2015-05-04 DIAGNOSIS — G43909 Migraine, unspecified, not intractable, without status migrainosus: Secondary | ICD-10-CM | POA: Diagnosis present

## 2015-05-04 DIAGNOSIS — Z85828 Personal history of other malignant neoplasm of skin: Secondary | ICD-10-CM | POA: Diagnosis not present

## 2015-05-04 DIAGNOSIS — Z96619 Presence of unspecified artificial shoulder joint: Secondary | ICD-10-CM

## 2015-05-04 DIAGNOSIS — R351 Nocturia: Secondary | ICD-10-CM | POA: Diagnosis present

## 2015-05-04 DIAGNOSIS — Z7982 Long term (current) use of aspirin: Secondary | ICD-10-CM

## 2015-05-04 DIAGNOSIS — I10 Essential (primary) hypertension: Secondary | ICD-10-CM | POA: Diagnosis present

## 2015-05-04 DIAGNOSIS — Z96612 Presence of left artificial shoulder joint: Secondary | ICD-10-CM

## 2015-05-04 DIAGNOSIS — I739 Peripheral vascular disease, unspecified: Secondary | ICD-10-CM | POA: Diagnosis present

## 2015-05-04 DIAGNOSIS — J45909 Unspecified asthma, uncomplicated: Secondary | ICD-10-CM | POA: Diagnosis present

## 2015-05-04 DIAGNOSIS — G629 Polyneuropathy, unspecified: Secondary | ICD-10-CM | POA: Diagnosis present

## 2015-05-04 HISTORY — PX: REVERSE SHOULDER ARTHROPLASTY: SHX5054

## 2015-05-04 SURGERY — ARTHROPLASTY, SHOULDER, TOTAL, REVERSE
Anesthesia: General | Site: Shoulder | Laterality: Left

## 2015-05-04 MED ORDER — SODIUM CHLORIDE 0.9 % IV SOLN
10.0000 mg | INTRAVENOUS | Status: DC | PRN
Start: 2015-05-04 — End: 2015-05-04
  Administered 2015-05-04: 10 ug/min via INTRAVENOUS

## 2015-05-04 MED ORDER — LIDOCAINE HCL (CARDIAC) 20 MG/ML IV SOLN
INTRAVENOUS | Status: AC
  Filled 2015-05-04: qty 5

## 2015-05-04 MED ORDER — ONDANSETRON HCL 4 MG/2ML IJ SOLN
INTRAMUSCULAR | Status: AC
  Filled 2015-05-04: qty 2

## 2015-05-04 MED ORDER — SODIUM CHLORIDE 0.9 % IJ SOLN
INTRAMUSCULAR | Status: AC
  Filled 2015-05-04: qty 10

## 2015-05-04 MED ORDER — PHENOL 1.4 % MT LIQD: 1.0000 | OROMUCOSAL | Status: DC | PRN

## 2015-05-04 MED ORDER — HYDROCODONE-ACETAMINOPHEN 5-325 MG PO TABS
1.0000 | ORAL_TABLET | ORAL | Status: DC | PRN
  Administered 2015-05-04 – 2015-05-05 (×5): 2 via ORAL
  Filled 2015-05-04 (×5): qty 2

## 2015-05-04 MED ORDER — METHOCARBAMOL 1000 MG/10ML IJ SOLN
500.0000 mg | Freq: Four times a day (QID) | INTRAVENOUS | Status: DC | PRN
  Filled 2015-05-04: qty 5

## 2015-05-04 MED ORDER — GLYCOPYRROLATE 0.2 MG/ML IJ SOLN
INTRAMUSCULAR | Status: DC | PRN
  Administered 2015-05-04: 0.4 mg via INTRAVENOUS

## 2015-05-04 MED ORDER — ROCURONIUM BROMIDE 100 MG/10ML IV SOLN
INTRAVENOUS | Status: DC | PRN
  Administered 2015-05-04: 40 mg via INTRAVENOUS

## 2015-05-04 MED ORDER — LIDOCAINE HCL (CARDIAC) 20 MG/ML IV SOLN
INTRAVENOUS | Status: DC | PRN
  Administered 2015-05-04: 40 mg via INTRAVENOUS

## 2015-05-04 MED ORDER — MIDAZOLAM HCL 2 MG/2ML IJ SOLN
INTRAMUSCULAR | Status: AC
  Filled 2015-05-04: qty 4

## 2015-05-04 MED ORDER — VANCOMYCIN HCL 1000 MG IV SOLR
INTRAVENOUS | Status: AC
  Filled 2015-05-04: qty 1000

## 2015-05-04 MED ORDER — DOXAZOSIN MESYLATE 1 MG PO TABS
1.0000 mg | ORAL_TABLET | Freq: Every day | ORAL | Status: DC
  Administered 2015-05-04: 1 mg via ORAL
  Filled 2015-05-04 (×3): qty 1

## 2015-05-04 MED ORDER — GLYCOPYRROLATE 0.2 MG/ML IJ SOLN
INTRAMUSCULAR | Status: AC
  Filled 2015-05-04: qty 2

## 2015-05-04 MED ORDER — ACETAMINOPHEN ER 650 MG PO TBCR: 650.0000 mg | EXTENDED_RELEASE_TABLET | Freq: Two times a day (BID) | ORAL | Status: DC

## 2015-05-04 MED ORDER — BUPIVACAINE-EPINEPHRINE (PF) 0.5% -1:200000 IJ SOLN
INTRAMUSCULAR | Status: DC | PRN
  Administered 2015-05-04: 30 mL via PERINEURAL

## 2015-05-04 MED ORDER — MORPHINE SULFATE (PF) 2 MG/ML IV SOLN: 2.0000 mg | INTRAVENOUS | Status: DC | PRN

## 2015-05-04 MED ORDER — DOCUSATE SODIUM 100 MG PO CAPS: 100.0000 mg | ORAL_CAPSULE | Freq: Two times a day (BID) | ORAL | Status: DC | PRN

## 2015-05-04 MED ORDER — MENTHOL 3 MG MT LOZG
1.0000 | LOZENGE | OROMUCOSAL | Status: DC | PRN
Start: 2015-05-04 — End: 2015-05-05

## 2015-05-04 MED ORDER — FENTANYL CITRATE (PF) 250 MCG/5ML IJ SOLN
INTRAMUSCULAR | Status: AC
  Filled 2015-05-04: qty 5

## 2015-05-04 MED ORDER — NEOSTIGMINE METHYLSULFATE 10 MG/10ML IV SOLN
INTRAVENOUS | Status: DC | PRN
  Administered 2015-05-04: 3 mg via INTRAVENOUS

## 2015-05-04 MED ORDER — METOCLOPRAMIDE HCL 5 MG/ML IJ SOLN: 5.0000 mg | Freq: Three times a day (TID) | INTRAMUSCULAR | Status: DC | PRN

## 2015-05-04 MED ORDER — ACETAMINOPHEN 325 MG PO TABS: 650.0000 mg | ORAL_TABLET | Freq: Four times a day (QID) | ORAL | Status: DC | PRN

## 2015-05-04 MED ORDER — CITALOPRAM HYDROBROMIDE 40 MG PO TABS
40.0000 mg | ORAL_TABLET | Freq: Every day | ORAL | Status: DC
  Administered 2015-05-04: 40 mg via ORAL
  Filled 2015-05-04: qty 1

## 2015-05-04 MED ORDER — CEFAZOLIN SODIUM-DEXTROSE 2-3 GM-% IV SOLR
2.0000 g | Freq: Four times a day (QID) | INTRAVENOUS | Status: AC
  Administered 2015-05-04 – 2015-05-05 (×3): 2 g via INTRAVENOUS
  Filled 2015-05-04 (×3): qty 50

## 2015-05-04 MED ORDER — PROPOFOL 10 MG/ML IV BOLUS
INTRAVENOUS | Status: DC | PRN
  Administered 2015-05-04: 150 mg via INTRAVENOUS

## 2015-05-04 MED ORDER — PROMETHAZINE HCL 25 MG/ML IJ SOLN: 6.2500 mg | INTRAMUSCULAR | Status: DC | PRN

## 2015-05-04 MED ORDER — FENTANYL CITRATE (PF) 100 MCG/2ML IJ SOLN
INTRAMUSCULAR | Status: DC | PRN
  Administered 2015-05-04: 50 ug via INTRAVENOUS

## 2015-05-04 MED ORDER — ASPIRIN EC 81 MG PO TBEC: 81.0000 mg | DELAYED_RELEASE_TABLET | ORAL | Status: DC

## 2015-05-04 MED ORDER — EPHEDRINE SULFATE 50 MG/ML IJ SOLN
INTRAMUSCULAR | Status: DC | PRN
  Administered 2015-05-04 (×2): 10 mg via INTRAVENOUS

## 2015-05-04 MED ORDER — METHOCARBAMOL 500 MG PO TABS
500.0000 mg | ORAL_TABLET | Freq: Four times a day (QID) | ORAL | Status: DC | PRN
  Administered 2015-05-05: 500 mg via ORAL
  Filled 2015-05-04: qty 1

## 2015-05-04 MED ORDER — METOCLOPRAMIDE HCL 5 MG PO TABS
5.0000 mg | ORAL_TABLET | Freq: Three times a day (TID) | ORAL | Status: DC | PRN
Start: 2015-05-04 — End: 2015-05-05

## 2015-05-04 MED ORDER — PROPOFOL 10 MG/ML IV BOLUS
INTRAVENOUS | Status: AC
  Filled 2015-05-04: qty 20

## 2015-05-04 MED ORDER — BUPROPION HCL ER (XL) 150 MG PO TB24
300.0000 mg | ORAL_TABLET | Freq: Every day | ORAL | Status: DC
  Administered 2015-05-05: 300 mg via ORAL
  Filled 2015-05-04: qty 2

## 2015-05-04 MED ORDER — ACETAMINOPHEN 650 MG RE SUPP: 650.0000 mg | Freq: Four times a day (QID) | RECTAL | Status: DC | PRN

## 2015-05-04 MED ORDER — SODIUM CHLORIDE 0.9 % IV SOLN: INTRAVENOUS | Status: DC

## 2015-05-04 MED ORDER — BUPIVACAINE-EPINEPHRINE 0.25% -1:200000 IJ SOLN
INTRAMUSCULAR | Status: DC | PRN
  Administered 2015-05-04: 10 mL

## 2015-05-04 MED ORDER — PANTOPRAZOLE SODIUM 40 MG PO TBEC
40.0000 mg | DELAYED_RELEASE_TABLET | Freq: Two times a day (BID) | ORAL | Status: DC
  Administered 2015-05-04 – 2015-05-05 (×2): 40 mg via ORAL
  Filled 2015-05-04 (×2): qty 1

## 2015-05-04 MED ORDER — MIDAZOLAM HCL 5 MG/5ML IJ SOLN
INTRAMUSCULAR | Status: DC | PRN
  Administered 2015-05-04: 2 mg via INTRAVENOUS

## 2015-05-04 MED ORDER — VANCOMYCIN HCL 500 MG IV SOLR
INTRAVENOUS | Status: AC
  Filled 2015-05-04: qty 500

## 2015-05-04 MED ORDER — SODIUM CHLORIDE 0.9 % IR SOLN
Status: DC | PRN
  Administered 2015-05-04: 1000 mL

## 2015-05-04 MED ORDER — HYDROCODONE-ACETAMINOPHEN 5-325 MG PO TABS: 1.0000 | ORAL_TABLET | Freq: Four times a day (QID) | ORAL | Status: DC | PRN

## 2015-05-04 MED ORDER — BISOPROLOL FUMARATE 5 MG PO TABS
5.0000 mg | ORAL_TABLET | Freq: Every day | ORAL | Status: DC
  Administered 2015-05-05: 5 mg via ORAL
  Filled 2015-05-04: qty 1

## 2015-05-04 MED ORDER — HYDROCHLOROTHIAZIDE 25 MG PO TABS
25.0000 mg | ORAL_TABLET | Freq: Every day | ORAL | Status: DC
  Administered 2015-05-04 – 2015-05-05 (×2): 25 mg via ORAL
  Filled 2015-05-04 (×2): qty 1

## 2015-05-04 MED ORDER — METHOCARBAMOL 500 MG PO TABS: 500.0000 mg | ORAL_TABLET | Freq: Three times a day (TID) | ORAL | Status: DC | PRN

## 2015-05-04 MED ORDER — BUPIVACAINE-EPINEPHRINE (PF) 0.25% -1:200000 IJ SOLN
INTRAMUSCULAR | Status: AC
  Filled 2015-05-04: qty 30

## 2015-05-04 MED ORDER — GABAPENTIN 300 MG PO CAPS
300.0000 mg | ORAL_CAPSULE | Freq: Two times a day (BID) | ORAL | Status: DC
  Administered 2015-05-04 – 2015-05-05 (×2): 300 mg via ORAL
  Filled 2015-05-04 (×2): qty 1

## 2015-05-04 MED ORDER — VITAMIN D3 50 MCG (2000 UT) PO TABS: 1.0000 | ORAL_TABLET | Freq: Every day | ORAL | Status: DC

## 2015-05-04 MED ORDER — VANCOMYCIN HCL 1000 MG IV SOLR
INTRAVENOUS | Status: DC | PRN
Start: 2015-05-04 — End: 2015-05-04
  Administered 2015-05-04: 1000 mg

## 2015-05-04 MED ORDER — ROCURONIUM BROMIDE 50 MG/5ML IV SOLN
INTRAVENOUS | Status: AC
  Filled 2015-05-04: qty 1

## 2015-05-04 MED ORDER — CALCIUM CARBONATE-VITAMIN D 500-200 MG-UNIT PO TABS
1.0000 | ORAL_TABLET | Freq: Every day | ORAL | Status: DC
  Administered 2015-05-04 – 2015-05-05 (×2): 1 via ORAL
  Filled 2015-05-04 (×3): qty 1

## 2015-05-04 MED ORDER — GABAPENTIN 600 MG PO TABS
600.0000 mg | ORAL_TABLET | Freq: Every day | ORAL | Status: DC
  Administered 2015-05-04: 600 mg via ORAL
  Filled 2015-05-04 (×2): qty 1

## 2015-05-04 MED ORDER — ONDANSETRON HCL 4 MG/2ML IJ SOLN
INTRAMUSCULAR | Status: DC | PRN
  Administered 2015-05-04: 4 mg via INTRAVENOUS

## 2015-05-04 MED ORDER — AMITRIPTYLINE HCL 50 MG PO TABS
50.0000 mg | ORAL_TABLET | Freq: Every day | ORAL | Status: DC
  Administered 2015-05-04: 50 mg via ORAL
  Filled 2015-05-04: qty 1

## 2015-05-04 MED ORDER — TRAMADOL HCL 50 MG PO TABS: 50.0000 mg | ORAL_TABLET | Freq: Three times a day (TID) | ORAL | Status: DC | PRN

## 2015-05-04 MED ORDER — ONDANSETRON HCL 4 MG/2ML IJ SOLN: 4.0000 mg | Freq: Four times a day (QID) | INTRAMUSCULAR | Status: DC | PRN

## 2015-05-04 MED ORDER — DEXAMETHASONE SODIUM PHOSPHATE 4 MG/ML IJ SOLN
INTRAMUSCULAR | Status: DC | PRN
  Administered 2015-05-04: 4 mg via INTRAVENOUS

## 2015-05-04 MED ORDER — MORPHINE SULFATE (PF) 2 MG/ML IV SOLN: 1.0000 mg | INTRAVENOUS | Status: DC | PRN

## 2015-05-04 MED ORDER — ARTIFICIAL TEARS OP OINT
TOPICAL_OINTMENT | OPHTHALMIC | Status: AC
  Filled 2015-05-04: qty 3.5

## 2015-05-04 MED ORDER — ONDANSETRON HCL 4 MG PO TABS: 4.0000 mg | ORAL_TABLET | Freq: Four times a day (QID) | ORAL | Status: DC | PRN

## 2015-05-04 MED ORDER — DEXAMETHASONE SODIUM PHOSPHATE 4 MG/ML IJ SOLN
INTRAMUSCULAR | Status: AC
  Filled 2015-05-04: qty 1

## 2015-05-04 MED ORDER — EPHEDRINE SULFATE 50 MG/ML IJ SOLN
INTRAMUSCULAR | Status: AC
  Filled 2015-05-04: qty 1

## 2015-05-04 SURGICAL SUPPLY — 61 items
BIT DRILL 5/64X5 DISP (BIT) IMPLANT
BLADE SAG 18X100X1.27 (BLADE) ×2 IMPLANT
BOWL SMART MIX CTS (DISPOSABLE) ×2 IMPLANT
CAPT SHLDR REVTOTAL 1 ×2 IMPLANT
CEMENT BONE DEPUY (Cement) ×2 IMPLANT
COVER SURGICAL LIGHT HANDLE (MISCELLANEOUS) ×2 IMPLANT
DRAPE IMP U-DRAPE 54X76 (DRAPES) ×2 IMPLANT
DRAPE INCISE IOBAN 66X45 STRL (DRAPES) ×2 IMPLANT
DRAPE ORTHO SPLIT 77X108 STRL (DRAPES) ×2
DRAPE SURG ORHT 6 SPLT 77X108 (DRAPES) ×2 IMPLANT
DRAPE U-SHAPE 47X51 STRL (DRAPES) ×2 IMPLANT
DRAPE X-RAY CASS 24X20 (DRAPES) IMPLANT
DRSG ADAPTIC 3X8 NADH LF (GAUZE/BANDAGES/DRESSINGS) ×2 IMPLANT
DRSG PAD ABDOMINAL 8X10 ST (GAUZE/BANDAGES/DRESSINGS) ×2 IMPLANT
DURAPREP 26ML APPLICATOR (WOUND CARE) ×2 IMPLANT
ELECT BLADE 4.0 EZ CLEAN MEGAD (MISCELLANEOUS) ×2
ELECT NEEDLE TIP 2.8 STRL (NEEDLE) ×2 IMPLANT
ELECT REM PT RETURN 9FT ADLT (ELECTROSURGICAL) ×2
ELECTRODE BLDE 4.0 EZ CLN MEGD (MISCELLANEOUS) ×1 IMPLANT
ELECTRODE REM PT RTRN 9FT ADLT (ELECTROSURGICAL) ×1 IMPLANT
GAUZE SPONGE 4X4 12PLY STRL (GAUZE/BANDAGES/DRESSINGS) ×2 IMPLANT
GLOVE BIOGEL PI ORTHO PRO 7.5 (GLOVE) ×1
GLOVE BIOGEL PI ORTHO PRO SZ8 (GLOVE) ×1
GLOVE ORTHO TXT STRL SZ7.5 (GLOVE) ×2 IMPLANT
GLOVE PI ORTHO PRO STRL 7.5 (GLOVE) ×1 IMPLANT
GLOVE PI ORTHO PRO STRL SZ8 (GLOVE) ×1 IMPLANT
GLOVE SURG ORTHO 8.5 STRL (GLOVE) ×2 IMPLANT
GOWN STRL REUS W/ TWL LRG LVL3 (GOWN DISPOSABLE) ×1 IMPLANT
GOWN STRL REUS W/ TWL XL LVL3 (GOWN DISPOSABLE) ×2 IMPLANT
GOWN STRL REUS W/TWL LRG LVL3 (GOWN DISPOSABLE) ×1
GOWN STRL REUS W/TWL XL LVL3 (GOWN DISPOSABLE) ×2
HANDPIECE INTERPULSE COAX TIP (DISPOSABLE)
KIT BASIN OR (CUSTOM PROCEDURE TRAY) ×2 IMPLANT
KIT ROOM TURNOVER OR (KITS) ×2 IMPLANT
MANIFOLD NEPTUNE II (INSTRUMENTS) ×2 IMPLANT
NEEDLE 1/2 CIR MAYO (NEEDLE) ×2 IMPLANT
NEEDLE HYPO 25GX1X1/2 BEV (NEEDLE) ×2 IMPLANT
NS IRRIG 1000ML POUR BTL (IV SOLUTION) ×2 IMPLANT
PACK SHOULDER (CUSTOM PROCEDURE TRAY) ×2 IMPLANT
PAD ARMBOARD 7.5X6 YLW CONV (MISCELLANEOUS) ×4 IMPLANT
SET HNDPC FAN SPRY TIP SCT (DISPOSABLE) IMPLANT
SLING ARM LRG ADULT FOAM STRAP (SOFTGOODS) ×2 IMPLANT
SLING ARM MED ADULT FOAM STRAP (SOFTGOODS) IMPLANT
SPONGE LAP 18X18 X RAY DECT (DISPOSABLE) IMPLANT
SPONGE LAP 4X18 X RAY DECT (DISPOSABLE) ×2 IMPLANT
STRIP CLOSURE SKIN 1/2X4 (GAUZE/BANDAGES/DRESSINGS) ×2 IMPLANT
SUCTION FRAZIER TIP 10 FR DISP (SUCTIONS) ×2 IMPLANT
SUT FIBERWIRE #2 38 T-5 BLUE (SUTURE) ×6
SUT MNCRL AB 4-0 PS2 18 (SUTURE) ×2 IMPLANT
SUT VIC AB 2-0 CT1 27 (SUTURE) ×2
SUT VIC AB 2-0 CT1 TAPERPNT 27 (SUTURE) ×2 IMPLANT
SUT VICRYL 0 CT 1 36IN (SUTURE) ×2 IMPLANT
SUTURE FIBERWR #2 38 T-5 BLUE (SUTURE) ×3 IMPLANT
SYR CONTROL 10ML LL (SYRINGE) ×2 IMPLANT
TOWEL OR 17X24 6PK STRL BLUE (TOWEL DISPOSABLE) ×2 IMPLANT
TOWEL OR 17X26 10 PK STRL BLUE (TOWEL DISPOSABLE) ×2 IMPLANT
TOWER CARTRIDGE SMART MIX (DISPOSABLE) IMPLANT
TRAY FOLEY CATH 16FRSI W/METER (SET/KITS/TRAYS/PACK) IMPLANT
TUBE CONNECTING 12X1/4 (SUCTIONS) ×2 IMPLANT
WATER STERILE IRR 1000ML POUR (IV SOLUTION) ×2 IMPLANT
YANKAUER SUCT BULB TIP NO VENT (SUCTIONS) ×2 IMPLANT

## 2015-05-04 NOTE — Progress Notes (Signed)
Patient states she has a burn on left shoulder and that there was a water blister that popped.

## 2015-05-04 NOTE — Transfer of Care (Signed)
Immediate Anesthesia Transfer of Care Note  Patient: Felicia Burke  Procedure(s) Performed: Procedure(s): LEFT SHOULDER REVERSE TOTAL SHOULDER ARTHROPLASTY (Left)  Patient Location: PACU  Anesthesia Type:General  Level of Consciousness: awake, alert  and oriented  Airway & Oxygen Therapy: Patient Spontanous Breathing and Patient connected to nasal cannula oxygen  Post-op Assessment: Report given to RN and Post -op Vital signs reviewed and stable  Post vital signs: Reviewed and stable  Last Vitals:  Filed Vitals:   05/04/15 0609  BP:   Pulse:   Temp: 36.4 C  Resp:     Complications: No apparent anesthesia complications

## 2015-05-04 NOTE — Discharge Instructions (Signed)
When possible keep the elbow propped forward so the arm rest across your abdomen.  You are ok to use the arm for light activity.  No heavy pushing or pulling with the arm.  Use sling as you need, may remove as tolerated.  Ice to the shoulder as much as you can.  Follow up with Dr Veverly Fells in two weeks in the office  [phone]

## 2015-05-04 NOTE — Plan of Care (Signed)
Problem: Consults Goal: Diagnosis - Shoulder Surgery Reverse Total Shoulder Arthroplasty     

## 2015-05-04 NOTE — Brief Op Note (Signed)
05/04/2015  10:25 AM  PATIENT:  Felicia Burke  59 y.o. 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  POST-OPERATIVE DIAGNOSIS:  left shoulder osteoarthritis and rotator cuff insufficiency  PROCEDURE:  Procedure(s): LEFT SHOULDER REVERSE TOTAL SHOULDER ARTHROPLASTY (Left) DePuy Delta Xtend with Vancomycin cement   SURGEON:  Surgeon(s) and Role:    * Netta Cedars, MD - Primary  PHYSICIAN ASSISTANT:   ASSISTANTS: Ventura Bruns, PA-C   ANESTHESIA:   regional and general  EBL:  Total I/O In: 1000 [I.V.:1000] Out: 200 [Blood:200]  BLOOD ADMINISTERED:none  DRAINS: none   LOCAL MEDICATIONS USED:  MARCAINE     SPECIMEN:  No Specimen  DISPOSITION OF SPECIMEN:  N/A  COUNTS:  YES  TOURNIQUET:  * No tourniquets in log *  DICTATION: .Other Dictation: Dictation Number [id number]  PLAN OF CARE: Admit to inpatient   PATIENT DISPOSITION:  PACU - hemodynamically stable.   Delay start of Pharmacological VTE agent (>24hrs) due to surgical blood loss or risk of bleeding: no

## 2015-05-04 NOTE — Anesthesia Procedure Notes (Addendum)
Anesthesia Regional Block:  Interscalene brachial plexus block  Pre-Anesthetic Checklist: ,, timeout performed, Correct Patient, Correct Site, Correct Laterality, Correct Procedure, Correct Position, site marked, Risks and benefits discussed,  Surgical consent,  Pre-op evaluation,  At surgeon's request and post-op pain management  Laterality: Left and Upper  Prep: chloraprep       Needles:   Needle Type: Echogenic Stimulator Needle     Needle Length: 9cm 9 cm Needle Gauge: 22 and 22 G  Needle insertion depth: 4 cm   Additional Needles:  Procedures: ultrasound guided (picture in chart) and nerve stimulator Interscalene brachial plexus block Narrative:  Start time: 05/04/2015 7:00 AM End time: 05/04/2015 7:15 AM Injection made incrementally with aspirations every 5 mL.  Performed by: Personally  Anesthesiologist: MASSAGEE, TERRY  Additional Notes: Tolerated well   Procedure Name: Intubation Date/Time: 05/04/2015 7:37 AM Performed by: Rush Farmer E Pre-anesthesia Checklist: Patient identified, Emergency Drugs available, Suction available, Patient being monitored and Timeout performed Patient Re-evaluated:Patient Re-evaluated prior to inductionOxygen Delivery Method: Circle system utilized Preoxygenation: Pre-oxygenation with 100% oxygen Intubation Type: IV induction Ventilation: Mask ventilation without difficulty Laryngoscope Size: Mac and 3 Grade View: Grade I Tube type: Oral Tube size: 7.0 mm Number of attempts: 1 Airway Equipment and Method: Stylet Placement Confirmation: ETT inserted through vocal cords under direct vision,  positive ETCO2 and breath sounds checked- equal and bilateral Secured at: 21 cm Tube secured with: Tape Dental Injury: Teeth and Oropharynx as per pre-operative assessment

## 2015-05-04 NOTE — Anesthesia Postprocedure Evaluation (Signed)
  Anesthesia Post-op Note  Patient: Felicia Burke  Procedure(s) Performed: Procedure(s): LEFT SHOULDER REVERSE TOTAL SHOULDER ARTHROPLASTY (Left)  Patient Location: PACU  Anesthesia Type:General and block  Level of Consciousness: awake and alert   Airway and Oxygen Therapy: Patient Spontanous Breathing  Post-op Pain: Controlled  Post-op Assessment: Post-op Vital signs reviewed, Patient's Cardiovascular Status Stable and Respiratory Function Stable  Post-op Vital Signs: Reviewed  Filed Vitals:   05/04/15 1115  BP: 129/66  Pulse: 71  Temp:   Resp: 15    Complications: No apparent anesthesia complications

## 2015-05-04 NOTE — Interval H&P Note (Signed)
History and Physical Interval Note:  05/04/2015 7:25 AM  Felicia Burke  has presented today for surgery, with the diagnosis of left shoulder osteoarthritis and rotator cuff insufficiency  The various methods of treatment have been discussed with the patient and family. After consideration of risks, benefits and other options for treatment, the patient has consented to  Procedure(s): LEFT SHOULDER REVERSE TOTAL SHOULDER ARTHROPLASTY (Left) as a surgical intervention .  The patient's history has been reviewed, patient examined, no change in status, stable for surgery.  I have reviewed the patient's chart and labs.  Questions were answered to the patient's satisfaction.     Donnie Gedeon,STEVEN R

## 2015-05-04 NOTE — Progress Notes (Signed)
Utilization review completed.  

## 2015-05-05 LAB — BASIC METABOLIC PANEL
Anion gap: 13 (ref 5–15)
BUN: 6 mg/dL (ref 6–20)
CALCIUM: 8.5 mg/dL — AB (ref 8.9–10.3)
CO2: 25 mmol/L (ref 22–32)
CREATININE: 0.55 mg/dL (ref 0.44–1.00)
Chloride: 94 mmol/L — ABNORMAL LOW (ref 101–111)
GFR calc non Af Amer: >60 mL/min (ref >60)
Glucose, Bld: 114 mg/dL — ABNORMAL HIGH (ref 65–99)
Potassium: 3.5 mmol/L (ref 3.5–5.1)
SODIUM: 132 mmol/L — AB (ref 135–145)

## 2015-05-05 LAB — HEMOGLOBIN AND HEMATOCRIT, BLOOD
HEMATOCRIT: 26.1 % — AB (ref 36.0–46.0)
HEMOGLOBIN: 8.6 g/dL — AB (ref 12.0–15.0)

## 2015-05-05 MED ORDER — OXYCODONE-ACETAMINOPHEN 5-325 MG PO TABS: 1.0000 | ORAL_TABLET | ORAL | Status: DC | PRN

## 2015-05-05 MED ORDER — PNEUMOCOCCAL VAC POLYVALENT 25 MCG/0.5ML IJ INJ: 0.5000 mL | INJECTION | INTRAMUSCULAR | Status: DC

## 2015-05-05 NOTE — Progress Notes (Signed)
   Subjective:  Patient reports pain as mild to moderate.  Pain controlled with PO meds. Denies N/V/CP/SOB. Has already walked in the hall with staff/family.  Objective:   VITALS:   Filed Vitals:   05/04/15 1115 05/04/15 1143 05/04/15 2129 05/05/15 0536  BP: 129/66 127/61 107/79 115/66  Pulse: 71 64 73 73  Temp: 97.9 F (36.6 C) 97 F (36.1 C) 98.2 F (36.8 C) 97.9 F (36.6 C)  TempSrc:   Oral Oral  Resp: 15 20 20 20   Height:      Weight:      SpO2: 100% 95% 94% 95%    ABD soft Intact pulses distally Incision: dressing C/D/I Compartment soft Subjective sensory change to thumb from vblock improving Motor intact  Lab Results  Component Value Date   WBC 6.0 04/25/2015   HGB 8.6* 05/05/2015   HCT 26.1* 05/05/2015   MCV 89.9 04/25/2015   PLT 235 04/25/2015   BMET    Component Value Date/Time   NA 132* 05/05/2015 0330   K 3.5 05/05/2015 0330   CL 94* 05/05/2015 0330   CO2 25 05/05/2015 0330   GLUCOSE 114* 05/05/2015 0330   BUN 6 05/05/2015 0330   CREATININE 0.55 05/05/2015 0330   CALCIUM 8.5* 05/05/2015 0330   GFRNONAA >60 05/05/2015 0330   GFRAA >60 05/05/2015 0330     Assessment/Plan: 1 Day Post-Op   Active Problems:   S/P shoulder replacement   Advance diet Up with therapy D/C IV fluids Discharge home with home health  ABLA on chronic anemia: asymptomatic, monitor D/C home when clears PT/OT   Felicia Burke, Horald Pollen 05/05/2015, 8:29 AM   Rod Can, MD Cell [phone]

## 2015-05-05 NOTE — Op Note (Signed)
NAMEALLAYA, Burke NO.:  [id number]  MEDICAL RECORD NO.:  [id number]  LOCATION:  5N31C                        FACILITY:  [facility]  PHYSICIAN:  Doran Heater. Veverly Fells, M.D. DATE OF BIRTH:  Jun 27, 1956  DATE OF PROCEDURE:  05/04/2015 DATE OF DISCHARGE:                              OPERATIVE REPORT   PREOPERATIVE DIAGNOSES:  Left shoulder osteoarthritis and rotator cuff insufficiency.  POSTOPERATIVE DIAGNOSES:  Left shoulder osteoarthritis and rotator cuff insufficiency as well as extensive scar tissue.  PROCEDURE PERFORMED:  Left shoulder reverse total shoulder arthroplasty using DePuy Delta Xtend prosthesis and also utilization of vancomycin impregnated cement.  ATTENDING SURGEON:  Doran Heater. Veverly Fells, M.D.  ASSISTANT:  Darol Destine, [facility] who scrubbed the entire procedure and necessary for satisfactory completion of the surgery.  ANESTHESIA:  General anesthesia was used plus interscalene block.  ESTIMATED BLOOD LOSS:  200 mL.  FLUID REPLACEMENT:  1200 mL crystalloids.  INSTRUMENT COUNTS:  Correct.  COMPLICATIONS:  There were no complications.  ANTIBIOTICS:  Perioperative antibiotics were given.  INDICATIONS:  The patient is a 59 year old female with a history of worsening left shoulder pain following left shoulder rotator cuff surgery as well as open Bankart reconstruction.  The patient had a stable shoulder following surgery, but was unable to develop much in the way of overhead activity or function.  She has had progressive pain, which appears to be related to progressive arthritis in her shoulder joint.  The patient now has very little motion, but does have good deltoid function and has severe arthritis.  Due to rotator cuff insufficiency, and lack of function progressive pain, we talked about options including a reverse shoulder arthroplasty to try to restore some function to her shoulder and also limited pain.  She agreed to this. Informed consent  obtained.  DESCRIPTION OF PROCEDURE:  After an adequate level of anesthesia was achieved, the patient was positioned in the modified beach-chair position.  Left shoulder correctly identified and sterilely prepped and draped in usual manner.  Time-out was called.  The patient had only forward elevation about 70 degrees, abduction 30, external rotation about 30, and internal rotation 20.  She had a very stiff shoulder on our exam.  Again, sterile prep and drape had been performed.  Time-out called.  We entered the shoulder used a deltopectoral incision starting at the coracoid process extending down to the anterior humerus. Dissection down through subcutaneous tissues using Bovie, identified the deltopectoral interval developed that using the Bovie and blunt dissection identified the subdeltoid interval and developed that of the top of the humerus and then went ahead and did free up the conjoined tendon which was scarred down of the subscapularis.  We identified the axillary nerve and protected that during this portion of the procedure. We then released the subscapularis which was extremely scarred and really had no bounce to it at all.  Even after was completely freed off to the side of the coracoid and freed all the way around, we released the pseudocapsule off the humerus as we progressively externally rotated, and extended the shoulder.  Once, we had that completely released inferiorly, we went ahead and did our superior release rotator  cuff repair had held, but it was extremely scarred and really did not look normal at all.  We went ahead and released that and removed all suture material.  We tagged the subscapularis for retraction, though we knew we are not going to be repairing it based on the lack of excursion with it.  We then did progressive external rotation, placed our Browne retractor and our [place], had good access to the superior aspect of the humerus.  There was complete  erosion of the humeral head cartilage and as well the socket cartilage was in bad shape as well.  We went ahead at this point and place entered the proximal humerus using a 6 mm reamer. We reamed up to a size 10 mm this felt like the canal of the humerus tapered pretty significantly distally, but we could get a 10 in.  We then did our metaphyseal preparation set on 10 degrees of retroversion with the metaphyseal reaming guide which was intramedullary.  Once, we had that reamed for the metaphyseal component.  We selected the size 10 stem with a size 1 left metaphysis and impacted that in position.  Once, we were sure, it can be seated.  We removed that, we subluxed the humerus posteriorly, did a 360 degree scar release and capsular removal. The patient's glenoid labrum had healed.  She had a large speed bump around the entire socket, so that it definitely healed, but there really was no cartilage left on the glenoid surface, so once we had the glenoid labrum removed.  All suture removed and the scarred capsule removed.  We had much better visualization.  We could find the center point for glenoid.  We removed the remaining scar with a Cobb elevator, placed our center guide pin reamed for the metaglene, drilled our central peg hole and impacted metaglene into position after irrigation.  We then placed 2 locking screws 30 inferiorly 36 into the base of the coracoid and 18 nonlocked anteriorly and posteriorly.  Had good purchase, nice stable metaglene and then placed a 38 Standard Glenosphere into position, impacted that and screwed it down solidly.  We checked to make sure the axillary nerve was out of the area and it was free and clear.  We then went ahead and directed our attention back toward the humerus.  We irrigated thoroughly.  Based on this being a revision surgery in the fact that the patient had quite a bit of suture material in the shoulder and open the cyst that was noted around  some suture anchors up around the humerus, they do not appear infectious.  We felt that we ought to go ahead and prophylax with vancomycin impregnated cement.  Her bone quality was not great.  We knew we need cement support, so we did go and vacuum mixed cement with vancomycin.  A gram of vancomycin mixed into it.  This was DePuy 1 cement, 1 package mix that on the back table and guided to the proper consistency and after thoroughly irrigating the humeral canal and drying the humeral canal, we inserted the cement manually and then placed the hydroxyapatite coated press-fit prosthesis in place.  This was a 10 body and epi 1 left metaphysis set on the 0 setting to place in 10 degrees retroversion.  Once that cement was hardened, we reduced the shoulder with +3 and then +6 and really found a great stability with +9 implant, so we used +38 +9 poly trial.  We then exchanged that for real  38+ 9 poly and placed that in place with good snap.  The conjoined tendons nicely tensioned.  Her axillary nerve was not under too much tension.  Also was able to a finger sweep around the back of the Glenosphere to make sure there was no bone or any other type of impinging lesions.  We also swept down along the inferior humeral neck to make sure, there was no bony impingement noted there.  We then went ahead and closed with interrupted #1 Vicryl closure for the deltopectoral interval after thorough irrigation.  A 2-0 Vicryl subcu and running 4-0 Monocryl for skin.  Steri-Strips applied followed by a sterile dressing.  The patient tolerated the surgery well.     Doran Heater. Veverly Fells, M.D.     SRN/MEDQ  D:  05/04/2015  T:  05/05/2015  Job:  [id number]

## 2015-05-05 NOTE — Evaluation (Signed)
Occupational Therapy Evaluation Patient Details Name: Felicia Burke MRN: [id number] DOB: 1956-07-04 Today's Date: 05/05/2015    History of Present Illness 59 y.o. s/p LEFT SHOULDER REVERSE TOTAL SHOULDER ARTHROPLASTY    Clinical Impression   Pt s/p above. Education provided in session and feel pt is safe to d/c home, from OT standpoint.     Follow Up Recommendations  No OT follow up;Supervision - Intermittent    Equipment Recommendations  None recommended by OT    Recommendations for Other Services       Precautions / Restrictions Precautions Precautions: Shoulder Type of Shoulder Precautions: Conservative Protocol: AROM elbow, wrist, hand; no PROM of shoulder; talked to PA Brad on the phone who reported that pt is not to perform shoulder abduction and is to do AAROM of Left shoulder and pendulums okay; no pushing, pulling, lifting with LUE (can use to hold light items) Shoulder Interventions: Shoulder sling/immobilizer;At all times;Off for dressing/bathing/exercises Precaution Booklet Issued: Yes (comment) Precaution Comments: educated on shoulder precautions Required Braces or Orthoses: Sling Restrictions Weight Bearing Restrictions: Yes LUE Weight Bearing: Non weight bearing      Mobility Bed Mobility Overal bed mobility: Modified Independent                Transfers Overall transfer level: Modified independent                    Balance      No apparent balance deficits-not formally assessed                                      ADL Overall ADL's : Needs assistance/impaired                         Toilet Transfer: Modified Independent (sit to stand from bed)           Functional mobility during ADLs: Modified independent General ADL Comments: Instructed pt on no scented deodorant under left arm and suggested cornstarch or unscented powder. Educated on safety such as safe footwear, and sitting for LB bathing, and  rugs/items on floor. Recommended someone be with her fro shower transfer. Suggested using reacher for high to reach items.     Vision     Perception     Praxis      Pertinent Vitals/Pain Pain Assessment: 0-10 Pain Score:  (4-5) Pain Location: LUE Pain Descriptors / Indicators: Aching;Sharp Pain Intervention(s): Monitored during session     Hand Dominance Right   Extremity/Trunk Assessment Upper Extremity Assessment Upper Extremity Assessment: LUE deficits/detail LUE Deficits / Details: able to move Lt elbow, wrist, hand, forearm WFL LUE Sensation: decreased light touch   Lower Extremity Assessment Lower Extremity Assessment: Overall WFL for tasks assessed       Communication Communication Communication: No difficulties   Cognition Arousal/Alertness: Awake/alert Behavior During Therapy: WFL for tasks assessed/performed Overall Cognitive Status: Within Functional Limits for tasks assessed                     General Comments       Exercises Exercises: Other exercises;Shoulder Other Exercises Other Exercises: Pt performed Lt pendulums  Other Exercises: Pt performed AAROM Lt shoulder FF and ER in supine position Other Exercises: Pt performed AROM left elbow flexion/extension, moved digits, and performed wrist flexion/extension   Shoulder Instructions Shoulder Instructions Donning/doffing shirt without moving shoulder:  (  pt verbalizes understanding; she is allowed to perform AAROM) Method for sponge bathing under operated UE:  (pt verbalized understanding) Donning/doffing sling/immobilizer: Supervision/safety (pt verbalized understanding; Pt/daughter practiced) Correct positioning of sling/immobilizer:  (pt verbalized understanding ) Pendulum exercises (written home exercise program): Moderate assistance ROM for elbow, wrist and digits of operated UE: Supervision/safety (verbalized understanding) Sling wearing schedule (on at all times/off for ADL's):  (pt  verbalized understanding) Proper positioning of operated UE when showering:  (pt verbalized understanding) Positioning of UE while sleeping: Patient able to independently direct caregiver    Home Living Family/patient expects to be discharged to:: Private residence Living Arrangements: Spouse/significant other Available Help at Discharge: Family;Available 24 hours/day Type of Home: House Home Access: Ramped entrance     Home Layout: One level     Bathroom Shower/Tub: Occupational psychologist: Standard     Home Equipment: Shower seat - built in          Prior Functioning/Environment Level of Independence: Independent             OT Diagnosis: Acute pain   OT Problem List:     OT Treatment/Interventions:      OT Goals(Current goals can be found in the care plan section) Acute Rehab OT Goals Patient Stated Goal: go home  OT Frequency:     Barriers to D/C:            Co-evaluation              End of Session Equipment Utilized During Treatment: Other (comment) (sling)  Activity Tolerance: Patient tolerated treatment well Patient left: in bed;with family/visitor present   Time: 0925-0958 OT Time Calculation (min): 33 min Charges:  OT General Charges $OT Visit: 1 Procedure OT Evaluation $Initial OT Evaluation Tier I: 1 Procedure OT Treatments $Therapeutic Exercise: 8-22 mins G-CodesBenito Mccreedy OTR/L [phone] 05/05/2015, 10:30 AM

## 2015-05-07 ENCOUNTER — Encounter (HOSPITAL_COMMUNITY): Payer: Self-pay | Admitting: Orthopedic Surgery

## 2015-05-16 NOTE — Discharge Summary (Signed)
Physician Discharge Summary   Patient ID: Felicia Burke MRN: [id number] DOB/AGE: [birth year] 59 y.o.  Admit date: 05/04/2015 Discharge date: 05/05/2015  Admission Diagnoses:  Active Problems:   S/P shoulder replacement   Discharge Diagnoses:  Same   Surgeries: Procedure(s): LEFT SHOULDER REVERSE TOTAL SHOULDER ARTHROPLASTY on 05/04/2015   Consultants: PT/OT  Discharged Condition: Stable  Hospital Course: Felicia Burke is an 59 y.o. female who was admitted 05/04/2015 with a chief complaint of No chief complaint on file. , and found to have a diagnosis of <principal problem not specified>.  They were brought to the operating room on 05/04/2015 and underwent the above named procedures.    The patient had an uncomplicated hospital course and was stable for discharge.  Recent vital signs:  Filed Vitals:   05/05/15 0536  BP: 115/66  Pulse: 73  Temp: 97.9 F (36.6 C)  Resp: 20    Recent laboratory studies:  Results for orders placed or performed during the hospital encounter of 05/04/15  Hemoglobin and hematocrit, blood  Result Value Ref Range   Hemoglobin 8.6 (L) 12.0 - 15.0 g/dL   HCT 26.1 (L) 36.0 - 78.2 %  Basic metabolic panel  Result Value Ref Range   Sodium 132 (L) 135 - 145 mmol/L   Potassium 3.5 3.5 - 5.1 mmol/L   Chloride 94 (L) 101 - 111 mmol/L   CO2 25 22 - 32 mmol/L   Glucose, Bld 114 (H) 65 - 99 mg/dL   BUN 6 6 - 20 mg/dL   Creatinine, Ser 0.55 0.44 - 1.00 mg/dL   Calcium 8.5 (L) 8.9 - 10.3 mg/dL   GFR calc non Af Amer >60 >60 mL/min   GFR calc Af Amer >60 >60 mL/min   Anion gap 13 5 - 15    Discharge Medications:     Medication List    TAKE these medications        AMBIEN 5 MG tablet  Generic drug:  zolpidem  Take 7.5 mg by mouth at bedtime as needed for sleep.     amitriptyline 50 MG tablet  Commonly known as:  ELAVIL  Take 50 mg by mouth at bedtime.     aspirin 81 MG tablet  Take 81 mg by mouth every Monday, Wednesday, and Friday.     bisoprolol 5 MG tablet  Commonly known as:  ZEBETA  Take 5 mg by mouth every morning.     buPROPion 300 MG 24 hr tablet  Commonly known as:  WELLBUTRIN XL  Take 300 mg by mouth daily.     CALTRATE 600+D 600-400 MG-UNIT per chew tablet  Generic drug:  Calcium Carbonate-Vitamin D  Chew 1 tablet by mouth daily.     citalopram 40 MG tablet  Commonly known as:  CELEXA  Take 40 mg by mouth at bedtime.     diphenhydrAMINE 50 MG tablet  Commonly known as:  BENADRYL  Take 50 mg by mouth every 8 (eight) hours as needed for itching or sleep.     docusate sodium 100 MG capsule  Commonly known as:  COLACE  Take 100 mg by mouth 2 (two) times daily as needed (for constipation).     doxazosin 1 MG tablet  Commonly known as:  CARDURA  Take 1 mg by mouth at bedtime.     gabapentin 300 MG capsule  Commonly known as:  NEURONTIN  Take 300-600 mg by mouth 3 (three) times daily. 300 mg twice daily. 600 mg at bedtime  hydrochlorothiazide 25 MG tablet  Commonly known as:  HYDRODIURIL  Take 25 mg by mouth every morning.     HYDROcodone-acetaminophen 5-325 MG per tablet  Commonly known as:  NORCO  Take 1 tablet by mouth every 6 (six) hours as needed for moderate pain.     ibuprofen 200 MG tablet  Commonly known as:  ADVIL,MOTRIN  Take 800 mg by mouth every 8 (eight) hours as needed for mild pain or moderate pain.     methocarbamol 500 MG tablet  Commonly known as:  ROBAXIN  Take 1 tablet (500 mg total) by mouth 3 (three) times daily as needed.     oxyCODONE-acetaminophen 5-325 MG per tablet  Commonly known as:  ROXICET  Take 1-2 tablets by mouth every 4 (four) hours as needed for severe pain.     pantoprazole 40 MG tablet  Commonly known as:  PROTONIX  Take 40 mg by mouth 2 (two) times daily.     traMADol 50 MG tablet  Commonly known as:  ULTRAM  Take 50-100 mg by mouth 3 (three) times daily. 100 mg twice daily. 50 mg after dinner     TYLENOL ARTHRITIS PAIN 650 MG CR tablet    Generic drug:  acetaminophen  Take 650 mg by mouth 2 (two) times daily.     Vitamin D3 2000 UNITS Tabs  Take 1 tablet by mouth daily.        Diagnostic Studies: Dg Shoulder Left Port  05/04/2015   CLINICAL DATA:  Status post left shoulder replacement.  EXAM: LEFT SHOULDER - 1 VIEW  COMPARISON:  None.  FINDINGS: The glenoid and humeral components appear to be well situated. No fracture or dislocation is noted. Visualized ribs appear normal.  IMPRESSION: Status post left shoulder arthroplasty.   Electronically Signed   By: Marijo Conception, M.D.   On: 05/04/2015 12:29    Disposition: 06-Home-Health Care Svc      Discharge Instructions    Call MD / Call 911    Complete by:  As directed   If you experience chest pain or shortness of breath, CALL 911 and be transported to the hospital emergency room.  If you develope a fever above 101 F, pus (white drainage) or increased drainage or redness at the wound, or calf pain, call your surgeon's office.     Constipation Prevention    Complete by:  As directed   Drink plenty of fluids.  Prune juice may be helpful.  You may use a stool softener, such as Colace (over the counter) 100 mg twice a day.  Use MiraLax (over the counter) for constipation as needed.     Diet - low sodium heart healthy    Complete by:  As directed      Driving restrictions    Complete by:  As directed   No driving for 6 weeks     Increase activity slowly as tolerated    Complete by:  As directed      Lifting restrictions    Complete by:  As directed   No lifting for 6 weeks           Follow-up Information    Follow up with NORRIS,STEVEN R, MD. Call in 2 weeks.   Specialty:  Orthopedic Surgery   Why:  [phone]   Contact information:   [address] [place] [address] [phone]        Signed: Ventura Bruns 05/16/2015, 10:02 AM

## 2015-07-10 ENCOUNTER — Other Ambulatory Visit: Payer: Self-pay

## 2015-07-10 DIAGNOSIS — Z1231 Encounter for screening mammogram for malignant neoplasm of breast: Secondary | ICD-10-CM

## 2015-08-17 ENCOUNTER — Ambulatory Visit
Admission: RE | Admit: 2015-08-17 | Discharge: 2015-08-17 | Disposition: A | Discharge status: Home / Self Care | Payer: 59 | Source: Ambulatory Visit

## 2015-08-17 DIAGNOSIS — Z1231 Encounter for screening mammogram for malignant neoplasm of breast: Secondary | ICD-10-CM

## 2015-08-20 ENCOUNTER — Other Ambulatory Visit: Payer: Self-pay | Admitting: Obstetrics and Gynecology

## 2015-08-20 DIAGNOSIS — R928 Other abnormal and inconclusive findings on diagnostic imaging of breast: Secondary | ICD-10-CM

## 2015-08-28 ENCOUNTER — Ambulatory Visit
Admission: RE | Admit: 2015-08-28 | Discharge: 2015-08-28 | Disposition: A | Discharge status: Home / Self Care | Payer: 59 | Source: Ambulatory Visit | Attending: Obstetrics and Gynecology | Admitting: Obstetrics and Gynecology

## 2015-08-28 DIAGNOSIS — R928 Other abnormal and inconclusive findings on diagnostic imaging of breast: Secondary | ICD-10-CM

## 2015-09-13 MED FILL — traMADol HCL 50 MG TABS: 50 | 30 days supply | Qty: 180 | Fill #2

## 2015-09-13 MED FILL — ZOLPIDEM TARTRATE 10 MG TAB: 10 | 30 days supply | Qty: 30 | Fill #2

## 2015-09-13 MED FILL — GABAPENTIN 300 MG CAPSULE: 300 | 30 days supply | Qty: 120 | Fill #0

## 2015-09-13 MED FILL — AMITRIPTYLINE HCL 50 MG TAB: 50 | 90 days supply | Qty: 90 | Fill #2

## 2015-09-13 MED FILL — CITALOPRAM HBR 40 MG TABLET: 40 | 90 days supply | Qty: 90 | Fill #2

## 2015-09-13 MED FILL — DOXAZOSIN MESYLATE 1 MG TAB: 1 | 90 days supply | Qty: 90 | Fill #2

## 2015-09-27 MED FILL — BUPROPION HCL XL 300 MG TAB: 300 | 90 days supply | Qty: 90 | Fill #0

## 2015-09-27 MED FILL — INTEGRA CAPSULE: 62.5-62.5-4 | 30 days supply | Qty: 30 | Fill #3

## 2015-10-03 MED FILL — BISOPROLOL FUMARATE 5 MG TA: 5 | 90 days supply | Qty: 90 | Fill #2

## 2015-10-03 MED FILL — HYDROCHLOROTHIAZIDE 25 MG T: 25 | 90 days supply | Qty: 90 | Fill #2

## 2015-10-11 DIAGNOSIS — G4700 Insomnia, unspecified: Secondary | ICD-10-CM | POA: Diagnosis not present

## 2015-10-11 DIAGNOSIS — I10 Essential (primary) hypertension: Secondary | ICD-10-CM | POA: Diagnosis not present

## 2015-10-11 DIAGNOSIS — F329 Major depressive disorder, single episode, unspecified: Secondary | ICD-10-CM | POA: Diagnosis not present

## 2015-10-11 DIAGNOSIS — G894 Chronic pain syndrome: Secondary | ICD-10-CM | POA: Diagnosis not present

## 2015-10-11 MED FILL — GABAPENTIN 300 MG CAPSULE: 300 | 90 days supply | Qty: 360 | Fill #0

## 2015-10-12 MED FILL — traMADol HCL 50 MG TABS: 50 | 30 days supply | Qty: 180 | Fill #0

## 2015-10-12 MED FILL — ZOLPIDEM TART ER 12.5 MG TA: 12.5 | 30 days supply | Qty: 30 | Fill #0

## 2015-10-12 MED FILL — KLOR-CON 10 MEQ TABLET: 10 | 84 days supply | Qty: 36 | Fill #0

## 2015-11-09 MED FILL — INTEGRA CAPSULE: 62.5-62.5-4 | 30 days supply | Qty: 30 | Fill #4

## 2015-11-09 MED FILL — ZOLPIDEM TART ER 12.5 MG TA: 12.5 | 30 days supply | Qty: 30 | Fill #1

## 2015-11-09 MED FILL — traMADol HCL 50 MG TABS: 50 | 30 days supply | Qty: 180 | Fill #1

## 2015-11-15 MED FILL — AMOXICILLIN 500 MG CAPSULE: 500 | 3 days supply | Qty: 12 | Fill #0

## 2015-11-21 DIAGNOSIS — J069 Acute upper respiratory infection, unspecified: Secondary | ICD-10-CM | POA: Diagnosis not present

## 2015-11-21 DIAGNOSIS — Z112 Encounter for screening for other bacterial diseases: Secondary | ICD-10-CM | POA: Diagnosis not present

## 2015-12-07 MED FILL — traMADol HCL 50 MG TABS: 50 | 30 days supply | Qty: 180 | Fill #2

## 2015-12-07 MED FILL — CITALOPRAM HBR 40 MG TABLET: 40 | 90 days supply | Qty: 90 | Fill #3

## 2015-12-07 MED FILL — AMITRIPTYLINE HCL 50 MG TAB: 50 | 90 days supply | Qty: 90 | Fill #3

## 2015-12-07 MED FILL — DOXAZOSIN MESYLATE 1 MG TAB: 1 | 90 days supply | Qty: 90 | Fill #3

## 2015-12-07 MED FILL — ZOLPIDEM TART ER 12.5 MG TA: 12.5 | 30 days supply | Qty: 30 | Fill #2

## 2015-12-07 MED FILL — PANTOPRAZOLE SOD DR 40 MG T: 40 | 90 days supply | Qty: 180 | Fill #2

## 2015-12-07 MED FILL — INTEGRA CAPSULE: 62.5-62.5-4 | 30 days supply | Qty: 30 | Fill #5

## 2015-12-09 MED FILL — GABAPENTIN 300 MG CAPSULE: 300 | 90 days supply | Qty: 270 | Fill #1

## 2015-12-26 DIAGNOSIS — F331 Major depressive disorder, recurrent, moderate: Secondary | ICD-10-CM | POA: Diagnosis not present

## 2015-12-26 DIAGNOSIS — I10 Essential (primary) hypertension: Secondary | ICD-10-CM | POA: Diagnosis not present

## 2015-12-26 DIAGNOSIS — F411 Generalized anxiety disorder: Secondary | ICD-10-CM | POA: Diagnosis not present

## 2015-12-26 DIAGNOSIS — G4700 Insomnia, unspecified: Secondary | ICD-10-CM | POA: Diagnosis not present

## 2015-12-26 MED FILL — hydrOXYzine HCL 25 MG TABS: 25 | 15 days supply | Qty: 60 | Fill #0

## 2015-12-26 MED FILL — SERTRALINE HCL 100 MG TAB: 100 | 90 days supply | Qty: 90 | Fill #0

## 2015-12-26 MED FILL — LISINOPRIL 10 MG TABLET: 10 | 90 days supply | Qty: 90 | Fill #0

## 2015-12-31 MED FILL — POTASSIUM CL 10 MEQ TAB SA: 10 | 84 days supply | Qty: 36 | Fill #1

## 2015-12-31 MED FILL — HYDROCHLOROTHIAZIDE 25 MG T: 25 | 90 days supply | Qty: 90 | Fill #3

## 2015-12-31 MED FILL — BUPROPION HCL XL 300 MG TAB: 300 | 90 days supply | Qty: 90 | Fill #1

## 2015-12-31 MED FILL — INTEGRA CAPSULE: 62.5-62.5-4 | 30 days supply | Qty: 30 | Fill #6

## 2016-01-07 IMAGING — CR DG CHEST 2V
2 series · 2 of 2 positions shown · non-contrast
Comparison: None

CLINICAL DATA: Preoperative evaluation for LEFT shoulder surgery,
history hypertension, asthma

EXAM:
CHEST  2 VIEW

[w chest pa]
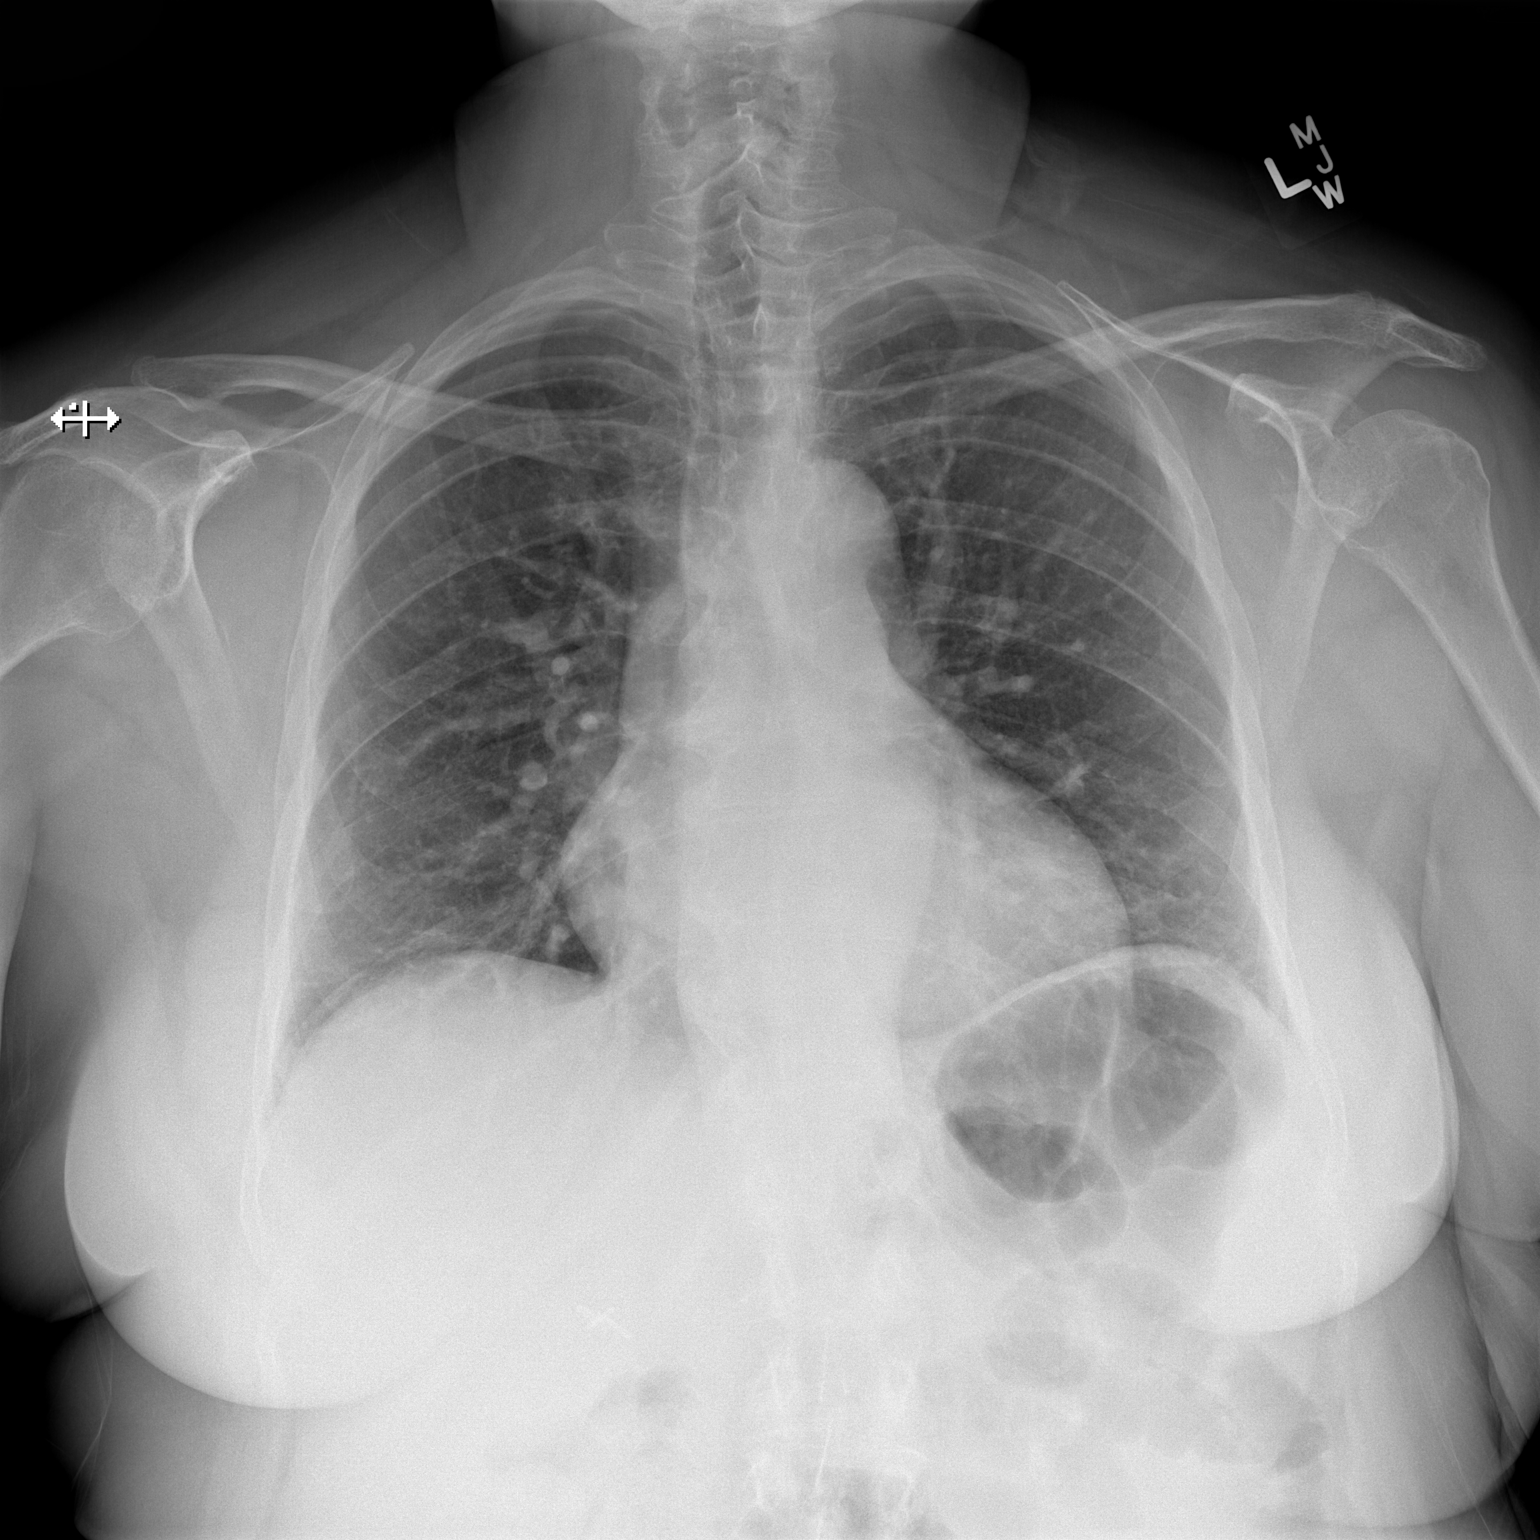

[w chest lat]
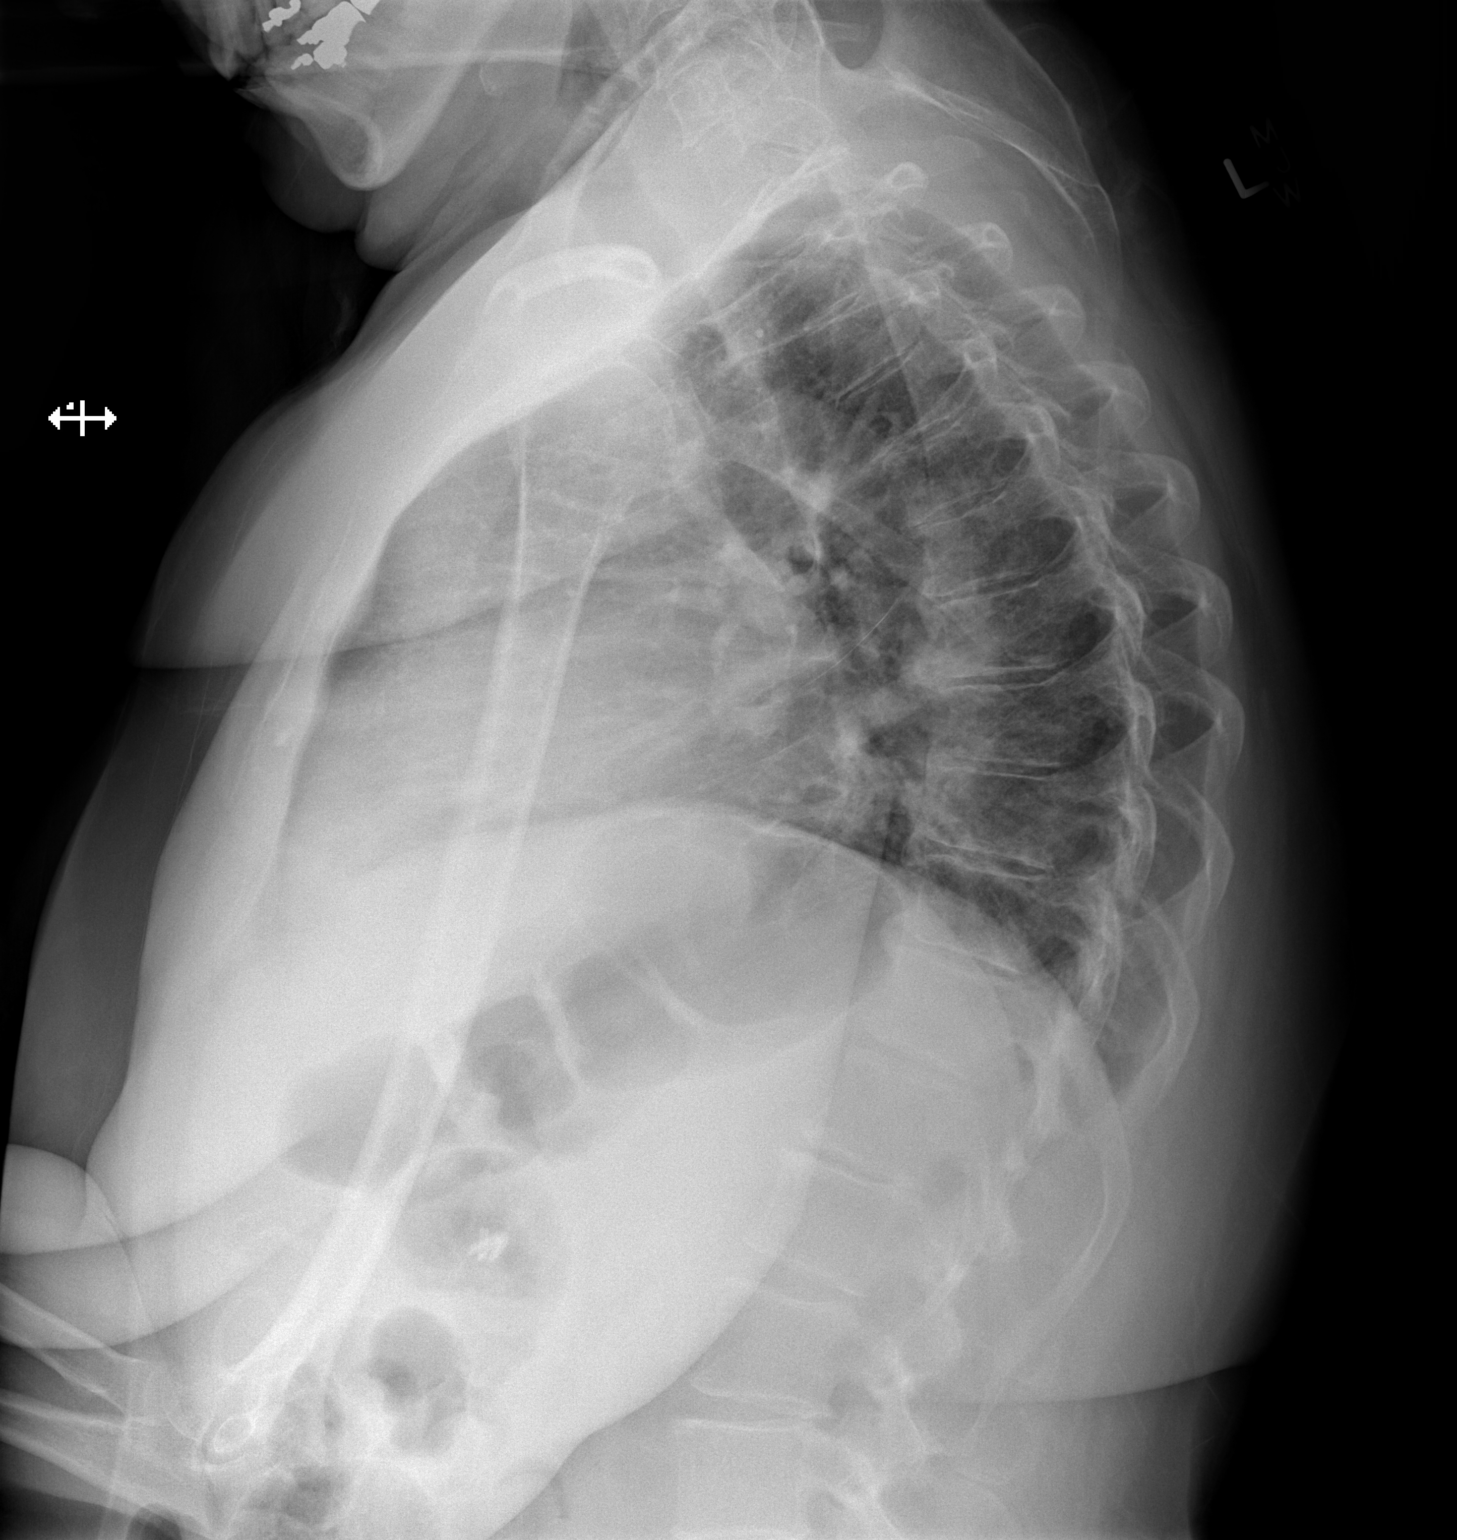

[2 of 2 positions shown; findings below may reference images not displayed]

FINDINGS: Enlargement of cardiac silhouette with slight pulmonary vascular
congestion.

Mild tortuosity of thoracic aorta.

Lungs clear.

No pleural effusion or pneumothorax.

Bones demineralized.
IMPRESSION: Enlargement of cardiac silhouette with pulmonary vascular
congestion.

No acute abnormalities.

## 2016-01-21 ENCOUNTER — Other Ambulatory Visit: Payer: Self-pay | Admitting: Obstetrics and Gynecology

## 2016-01-21 DIAGNOSIS — F331 Major depressive disorder, recurrent, moderate: Secondary | ICD-10-CM | POA: Diagnosis not present

## 2016-01-21 DIAGNOSIS — I10 Essential (primary) hypertension: Secondary | ICD-10-CM | POA: Diagnosis not present

## 2016-01-21 DIAGNOSIS — F411 Generalized anxiety disorder: Secondary | ICD-10-CM | POA: Diagnosis not present

## 2016-01-21 DIAGNOSIS — G4700 Insomnia, unspecified: Secondary | ICD-10-CM | POA: Diagnosis not present

## 2016-01-21 DIAGNOSIS — N6489 Other specified disorders of breast: Secondary | ICD-10-CM

## 2016-01-21 MED FILL — TEMAZEPAM 30 MG CAPSULE: 30 | 30 days supply | Qty: 30 | Fill #0

## 2016-01-21 MED FILL — LISINOPRIL 20 MG TABLET: 20 | 90 days supply | Qty: 90 | Fill #0

## 2016-01-22 MED FILL — MIRTAZAPINE 15 MG ODT: 15 | 90 days supply | Qty: 90 | Fill #0

## 2016-02-04 MED FILL — AMOXICILLIN 500 MG CAPSULE: 500 | 3 days supply | Qty: 12 | Fill #1

## 2016-02-13 MED FILL — SERTRALINE HCL 100 MG TAB: 100 | 90 days supply | Qty: 180 | Fill #0

## 2016-02-19 MED FILL — AMOXICILLIN 500 MG CAPSULE: 500 | 3 days supply | Qty: 12 | Fill #2

## 2016-02-19 MED FILL — TEMAZEPAM 30 MG CAPSULE: 30 | 30 days supply | Qty: 30 | Fill #1

## 2016-02-21 DIAGNOSIS — R300 Dysuria: Secondary | ICD-10-CM | POA: Diagnosis not present

## 2016-02-21 MED FILL — CEPHALEXIN 500 MG CAPSULE: 500 | 7 days supply | Qty: 14 | Fill #0

## 2016-02-21 MED FILL — PHENAZOPYRIDINE 200 MG TAB: 200 | 5 days supply | Qty: 15 | Fill #0

## 2016-02-25 MED FILL — traMADol HCL 50 MG TABS: 50 | 30 days supply | Qty: 180 | Fill #0

## 2016-02-28 MED FILL — PHENAZOPYRIDINE 200 MG TAB: 200 | 5 days supply | Qty: 15 | Fill #1

## 2016-02-28 MED FILL — PANTOPRAZOLE SOD DR 40 MG T: 40 | 90 days supply | Qty: 180 | Fill #3

## 2016-03-05 ENCOUNTER — Ambulatory Visit
Admission: RE | Admit: 2016-03-05 | Discharge: 2016-03-05 | Disposition: A | Discharge status: Home / Self Care | Payer: 59 | Source: Ambulatory Visit | Attending: Obstetrics and Gynecology | Admitting: Obstetrics and Gynecology

## 2016-03-05 DIAGNOSIS — R300 Dysuria: Secondary | ICD-10-CM | POA: Diagnosis not present

## 2016-03-05 DIAGNOSIS — F331 Major depressive disorder, recurrent, moderate: Secondary | ICD-10-CM | POA: Diagnosis not present

## 2016-03-05 DIAGNOSIS — R3121 Asymptomatic microscopic hematuria: Secondary | ICD-10-CM | POA: Diagnosis not present

## 2016-03-05 DIAGNOSIS — I10 Essential (primary) hypertension: Secondary | ICD-10-CM | POA: Diagnosis not present

## 2016-03-05 DIAGNOSIS — R928 Other abnormal and inconclusive findings on diagnostic imaging of breast: Secondary | ICD-10-CM | POA: Diagnosis not present

## 2016-03-05 DIAGNOSIS — G43109 Migraine with aura, not intractable, without status migrainosus: Secondary | ICD-10-CM | POA: Diagnosis not present

## 2016-03-05 DIAGNOSIS — G4700 Insomnia, unspecified: Secondary | ICD-10-CM | POA: Diagnosis not present

## 2016-03-05 DIAGNOSIS — N6489 Other specified disorders of breast: Secondary | ICD-10-CM

## 2016-03-05 DIAGNOSIS — K219 Gastro-esophageal reflux disease without esophagitis: Secondary | ICD-10-CM | POA: Diagnosis not present

## 2016-03-12 MED FILL — GABAPENTIN 300 MG CAPSULE: 300 | 90 days supply | Qty: 360 | Fill #1

## 2016-03-12 MED FILL — BISOPROLOL FUMARATE 5 MG TA: 5 | 90 days supply | Qty: 90 | Fill #3

## 2016-03-20 MED FILL — AMOXICILLIN 500 MG CAPSULE: 500 | 3 days supply | Qty: 12 | Fill #3

## 2016-03-20 MED FILL — POTASSIUM CL 10 MEQ TAB SA: 10 | 84 days supply | Qty: 36 | Fill #2

## 2016-03-20 MED FILL — TEMAZEPAM 30 MG CAPSULE: 30 | 30 days supply | Qty: 30 | Fill #0

## 2016-03-28 DIAGNOSIS — R3129 Other microscopic hematuria: Secondary | ICD-10-CM | POA: Diagnosis not present

## 2016-03-28 DIAGNOSIS — R3121 Asymptomatic microscopic hematuria: Secondary | ICD-10-CM | POA: Diagnosis not present

## 2016-04-02 MED FILL — BUPROPION HCL XL 300 MG TAB: 300 | 90 days supply | Qty: 90 | Fill #2

## 2016-04-02 MED FILL — traMADol HCL 50 MG TABS: 50 | 30 days supply | Qty: 180 | Fill #1

## 2016-04-14 MED FILL — AMOXICILLIN 500 MG CAPSULE: 500 | 3 days supply | Qty: 12 | Fill #4

## 2016-04-14 MED FILL — MIRTAZAPINE 15 MG ODT: 15 | 90 days supply | Qty: 90 | Fill #1

## 2016-04-14 MED FILL — LISINOPRIL 20 MG TABLET: 20 | 90 days supply | Qty: 90 | Fill #1

## 2016-04-14 MED FILL — HYDROCHLOROTHIAZIDE 25 MG T: 25 | 90 days supply | Qty: 90 | Fill #0

## 2016-04-23 MED FILL — TEMAZEPAM 30 MG CAPSULE: 30 | 30 days supply | Qty: 30 | Fill #1

## 2016-04-28 DIAGNOSIS — R3121 Asymptomatic microscopic hematuria: Secondary | ICD-10-CM | POA: Diagnosis not present

## 2016-04-28 DIAGNOSIS — R351 Nocturia: Secondary | ICD-10-CM | POA: Diagnosis not present

## 2016-05-06 MED FILL — SERTRALINE HCL 100 MG TAB: 100 | 90 days supply | Qty: 180 | Fill #1

## 2016-05-06 MED FILL — traMADol HCL 50 MG TABS: 50 | 30 days supply | Qty: 180 | Fill #2

## 2016-05-06 MED FILL — AMOXICILLIN 500 MG CAPSULE: 500 | 3 days supply | Qty: 12 | Fill #5

## 2016-05-21 DIAGNOSIS — G894 Chronic pain syndrome: Secondary | ICD-10-CM | POA: Diagnosis not present

## 2016-05-21 DIAGNOSIS — F331 Major depressive disorder, recurrent, moderate: Secondary | ICD-10-CM | POA: Diagnosis not present

## 2016-05-21 DIAGNOSIS — Z471 Aftercare following joint replacement surgery: Secondary | ICD-10-CM | POA: Diagnosis not present

## 2016-05-21 DIAGNOSIS — F411 Generalized anxiety disorder: Secondary | ICD-10-CM | POA: Diagnosis not present

## 2016-05-21 DIAGNOSIS — I10 Essential (primary) hypertension: Secondary | ICD-10-CM | POA: Diagnosis not present

## 2016-05-21 DIAGNOSIS — M19011 Primary osteoarthritis, right shoulder: Secondary | ICD-10-CM | POA: Diagnosis not present

## 2016-05-21 DIAGNOSIS — G43109 Migraine with aura, not intractable, without status migrainosus: Secondary | ICD-10-CM | POA: Diagnosis not present

## 2016-05-21 DIAGNOSIS — D509 Iron deficiency anemia, unspecified: Secondary | ICD-10-CM | POA: Diagnosis not present

## 2016-05-21 DIAGNOSIS — R739 Hyperglycemia, unspecified: Secondary | ICD-10-CM | POA: Diagnosis not present

## 2016-05-21 DIAGNOSIS — E876 Hypokalemia: Secondary | ICD-10-CM | POA: Diagnosis not present

## 2016-05-21 DIAGNOSIS — Z789 Other specified health status: Secondary | ICD-10-CM | POA: Diagnosis not present

## 2016-05-27 MED FILL — GABAPENTIN 300 MG CAPSULE: 300 | 90 days supply | Qty: 270 | Fill #2

## 2016-05-27 MED FILL — TEMAZEPAM 30 MG CAPSULE: 30 | 30 days supply | Qty: 30 | Fill #0

## 2016-06-03 MED FILL — traMADol HCL 50 MG TABS: 50 | 30 days supply | Qty: 180 | Fill #0

## 2016-06-16 MED FILL — POTASSIUM CL 10 MEQ TAB SA: 10 | 84 days supply | Qty: 36 | Fill #0

## 2016-06-16 MED FILL — AMOXICILLIN 500 MG CAPSULE: 500 | 3 days supply | Qty: 12 | Fill #6

## 2016-06-24 DIAGNOSIS — H5213 Myopia, bilateral: Secondary | ICD-10-CM | POA: Diagnosis not present

## 2016-06-26 MED FILL — TEMAZEPAM 30 MG CAPSULE: 30 | 30 days supply | Qty: 30 | Fill #1

## 2016-07-04 MED FILL — traMADol HCL 50 MG TABS: 50 | 30 days supply | Qty: 180 | Fill #1

## 2016-07-04 MED FILL — BUPROPION HCL XL 300 MG TAB: 300 | 90 days supply | Qty: 90 | Fill #3

## 2016-07-08 DIAGNOSIS — L814 Other melanin hyperpigmentation: Secondary | ICD-10-CM | POA: Diagnosis not present

## 2016-07-08 DIAGNOSIS — D1800 Hemangioma unspecified site: Secondary | ICD-10-CM | POA: Diagnosis not present

## 2016-07-08 DIAGNOSIS — D2272 Melanocytic nevi of left lower limb, including hip: Secondary | ICD-10-CM | POA: Diagnosis not present

## 2016-07-08 DIAGNOSIS — B078 Other viral warts: Secondary | ICD-10-CM | POA: Diagnosis not present

## 2016-07-08 DIAGNOSIS — L821 Other seborrheic keratosis: Secondary | ICD-10-CM | POA: Diagnosis not present

## 2016-07-08 DIAGNOSIS — Z85828 Personal history of other malignant neoplasm of skin: Secondary | ICD-10-CM | POA: Diagnosis not present

## 2016-07-08 DIAGNOSIS — Z01419 Encounter for gynecological examination (general) (routine) without abnormal findings: Secondary | ICD-10-CM | POA: Diagnosis not present

## 2016-07-08 DIAGNOSIS — N390 Urinary tract infection, site not specified: Secondary | ICD-10-CM | POA: Diagnosis not present

## 2016-07-08 DIAGNOSIS — Z23 Encounter for immunization: Secondary | ICD-10-CM | POA: Diagnosis not present

## 2016-07-08 DIAGNOSIS — D225 Melanocytic nevi of trunk: Secondary | ICD-10-CM | POA: Diagnosis not present

## 2016-07-08 DIAGNOSIS — Z6836 Body mass index (BMI) 36.0-36.9, adult: Secondary | ICD-10-CM | POA: Diagnosis not present

## 2016-07-08 DIAGNOSIS — N952 Postmenopausal atrophic vaginitis: Secondary | ICD-10-CM | POA: Diagnosis not present

## 2016-07-08 MED FILL — NITROFURANTOIN MONO-MCR 100: 100 | 7 days supply | Qty: 14 | Fill #0

## 2016-07-08 MED FILL — ESTRACE 0.01% CREAM: 0.1 | 90 days supply | Qty: 43 | Fill #0

## 2016-07-14 MED FILL — LISINOPRIL 20 MG TABLET: 20 | 90 days supply | Qty: 90 | Fill #2

## 2016-07-14 MED FILL — HYDROCHLOROTHIAZIDE 25 MG T: 25 | 90 days supply | Qty: 90 | Fill #1

## 2016-07-14 MED FILL — MIRTAZAPINE 15 MG ODT: 15 | 90 days supply | Qty: 90 | Fill #2

## 2016-07-17 DIAGNOSIS — M25511 Pain in right shoulder: Secondary | ICD-10-CM | POA: Diagnosis not present

## 2016-07-21 ENCOUNTER — Other Ambulatory Visit: Payer: Self-pay | Admitting: Obstetrics and Gynecology

## 2016-07-21 DIAGNOSIS — Z1231 Encounter for screening mammogram for malignant neoplasm of breast: Secondary | ICD-10-CM

## 2016-07-24 MED FILL — SULFAMETHOXAZOLE-TMP DS TAB: 800-160 | 5 days supply | Qty: 10 | Fill #0

## 2016-07-25 MED FILL — TEMAZEPAM 30 MG CAPSULE: 30 | 30 days supply | Qty: 30 | Fill #2

## 2016-07-25 MED FILL — AMOXICILLIN 500 MG CAPSULE: 500 | 3 days supply | Qty: 12 | Fill #7

## 2016-07-29 DIAGNOSIS — F331 Major depressive disorder, recurrent, moderate: Secondary | ICD-10-CM | POA: Diagnosis not present

## 2016-07-29 DIAGNOSIS — F411 Generalized anxiety disorder: Secondary | ICD-10-CM | POA: Diagnosis not present

## 2016-07-30 MED FILL — MIRTAZAPINE 30 MG ODT: 30 | 30 days supply | Qty: 30 | Fill #0

## 2016-07-30 MED FILL — busPIRone HCL 10 MG TABS: 10 | 30 days supply | Qty: 60 | Fill #0

## 2016-07-31 MED FILL — traMADol HCL 50 MG TABS: 50 | 30 days supply | Qty: 180 | Fill #2

## 2016-07-31 MED FILL — SERTRALINE HCL 100 MG TAB: 100 | 90 days supply | Qty: 180 | Fill #2

## 2016-08-03 DIAGNOSIS — I959 Hypotension, unspecified: Secondary | ICD-10-CM | POA: Diagnosis not present

## 2016-08-03 DIAGNOSIS — R6521 Severe sepsis with septic shock: Secondary | ICD-10-CM | POA: Diagnosis not present

## 2016-08-03 DIAGNOSIS — R55 Syncope and collapse: Secondary | ICD-10-CM | POA: Diagnosis not present

## 2016-08-03 DIAGNOSIS — K529 Noninfective gastroenteritis and colitis, unspecified: Secondary | ICD-10-CM | POA: Diagnosis not present

## 2016-08-03 DIAGNOSIS — R109 Unspecified abdominal pain: Secondary | ICD-10-CM | POA: Diagnosis not present

## 2016-08-03 DIAGNOSIS — N179 Acute kidney failure, unspecified: Secondary | ICD-10-CM | POA: Diagnosis not present

## 2016-08-03 DIAGNOSIS — G629 Polyneuropathy, unspecified: Secondary | ICD-10-CM | POA: Diagnosis not present

## 2016-08-03 DIAGNOSIS — G4700 Insomnia, unspecified: Secondary | ICD-10-CM | POA: Diagnosis not present

## 2016-08-03 DIAGNOSIS — R197 Diarrhea, unspecified: Secondary | ICD-10-CM | POA: Diagnosis not present

## 2016-08-03 DIAGNOSIS — E860 Dehydration: Secondary | ICD-10-CM | POA: Diagnosis not present

## 2016-08-03 DIAGNOSIS — A048 Other specified bacterial intestinal infections: Secondary | ICD-10-CM | POA: Diagnosis not present

## 2016-08-03 DIAGNOSIS — A419 Sepsis, unspecified organism: Secondary | ICD-10-CM | POA: Diagnosis not present

## 2016-08-03 DIAGNOSIS — I10 Essential (primary) hypertension: Secondary | ICD-10-CM | POA: Diagnosis not present

## 2016-08-11 DIAGNOSIS — F331 Major depressive disorder, recurrent, moderate: Secondary | ICD-10-CM | POA: Diagnosis not present

## 2016-08-11 DIAGNOSIS — K529 Noninfective gastroenteritis and colitis, unspecified: Secondary | ICD-10-CM | POA: Diagnosis not present

## 2016-08-11 DIAGNOSIS — F411 Generalized anxiety disorder: Secondary | ICD-10-CM | POA: Diagnosis not present

## 2016-08-11 DIAGNOSIS — M19011 Primary osteoarthritis, right shoulder: Secondary | ICD-10-CM | POA: Diagnosis not present

## 2016-08-11 DIAGNOSIS — K219 Gastro-esophageal reflux disease without esophagitis: Secondary | ICD-10-CM | POA: Diagnosis not present

## 2016-08-11 MED FILL — PANTOPRAZOLE SOD DR 40 MG T: 40 | 90 days supply | Qty: 180 | Fill #0

## 2016-08-22 MED FILL — AMOXICILLIN 500 MG CAPSULE: 500 | 3 days supply | Qty: 12 | Fill #8

## 2016-08-25 MED FILL — busPIRone HCL 10 MG TABS: 10 | 30 days supply | Qty: 60 | Fill #1

## 2016-08-25 MED FILL — POTASSIUM CL ER 10 MEQ TAB: 10 | 84 days supply | Qty: 36 | Fill #3

## 2016-08-25 MED FILL — GABAPENTIN 300 MG CAPSULE: 300 | 90 days supply | Qty: 360 | Fill #2

## 2016-08-25 MED FILL — TEMAZEPAM 30 MG CAPSULE: 30 | 30 days supply | Qty: 30 | Fill #0

## 2016-08-25 MED FILL — MIRTAZAPINE 30 MG ODT: 30 | 30 days supply | Qty: 30 | Fill #1

## 2016-08-27 DIAGNOSIS — M19011 Primary osteoarthritis, right shoulder: Secondary | ICD-10-CM | POA: Diagnosis not present

## 2016-08-27 DIAGNOSIS — M25311 Other instability, right shoulder: Secondary | ICD-10-CM | POA: Diagnosis not present

## 2016-09-02 ENCOUNTER — Ambulatory Visit: Payer: 59

## 2016-09-03 ENCOUNTER — Ambulatory Visit
Admission: RE | Admit: 2016-09-03 | Discharge: 2016-09-03 | Disposition: A | Discharge status: Home / Self Care | Payer: 59 | Source: Ambulatory Visit | Attending: Obstetrics and Gynecology | Admitting: Obstetrics and Gynecology

## 2016-09-03 DIAGNOSIS — Z1231 Encounter for screening mammogram for malignant neoplasm of breast: Secondary | ICD-10-CM | POA: Diagnosis not present

## 2016-09-15 MED FILL — busPIRone HCL 10 MG TABS: 10 | 90 days supply | Qty: 180 | Fill #0

## 2016-09-15 MED FILL — POTASSIUM CL ER 10 MEQ TAB: 10 | 84 days supply | Qty: 36 | Fill #1

## 2016-09-15 MED FILL — AMOXICILLIN 500 MG CAPSULE: 500 | 3 days supply | Qty: 12 | Fill #9

## 2016-09-15 MED FILL — traMADol HCL 50 MG TABS: 50 | 30 days supply | Qty: 180 | Fill #0

## 2016-09-22 MED FILL — MIRTAZAPINE 30 MG ODT: 30 | 30 days supply | Qty: 30 | Fill #2

## 2016-09-22 MED FILL — AMOXICILLIN 500 MG CAPSULE: 500 | 3 days supply | Qty: 12 | Fill #10

## 2016-09-22 MED FILL — TEMAZEPAM 30 MG CAPSULE: 30 | 30 days supply | Qty: 30 | Fill #1

## 2016-09-26 NOTE — Pre-Procedure Instructions (Signed)
Felicia Burke  09/26/2016      [place], [place] - [address] [facility]. [address] [place] [address] Phone: [phone] Fax: [place], [place] - [address] [place] [address] Phone: [phone] Fax: [phone]  [place], [place] - [address] [place] [address] Phone: [phone] Fax: [phone]    Your procedure is scheduled on Fri, Feb 2 @ 11:00 AM  Report to [place] at 9:00 AM  Call this number if you have problems the morning of surgery:  [phone]   Remember:  Do not eat food or drink liquids after midnight.  Take these medicines the morning of surgery with A SIP OF WATER Wellbutrin(Bupropion),Buspar(Buspirone),Pantoprazole(Protonix),Zoloft(Sertraline),and Tramadol(Ultram-if needed)             Stop taking your Ibuprofen along with any Vitamins or Herbal Medications. No Goody's,BC's,Aleve,Advil,Motrin,Aspirin,or Fish Oil.    Do not wear jewelry, make-up or nail polish.  Do not wear lotions, powders,perfumes, or deoderant.  Do not shave 48 hours prior to surgery.    Do not bring valuables to the hospital.  [facility] is not responsible for any belongings or valuables.  Contacts, dentures or bridgework may not be worn into surgery.  Leave your suitcase in the car.  After surgery it may be brought to your room.  For patients admitted to the hospital, discharge time will be determined by your treatment team.  Patients discharged the day of surgery will not be allowed to drive home.    [facility]  Before surgery, you can play an important role.  Because skin is not sterile, your skin needs to be as free of germs as possible.  You can reduce the number of germs on you skin by washing with CHG (chlorahexidine  gluconate) soap before surgery.  CHG is an antiseptic cleaner which kills germs and bonds with the skin to continue killing germs even after washing.  Please DO NOT use if you have an allergy to CHG or antibacterial soaps.  If your skin becomes reddened/irritated stop using the CHG and inform your nurse when you arrive at Short Stay.  Do not shave (including legs and underarms) for at least 48 hours prior to the first CHG shower.  You may shave your face.  Please follow these instructions carefully:   1.  Shower with CHG Soap the night before surgery and the                                morning of Surgery.  2.  If you choose to wash your hair, wash your hair first as usual with your       normal shampoo.  3.  After you shampoo, rinse your hair and body thoroughly to remove the                      Shampoo.  4.  Use CHG as you would any other liquid soap.  You can apply chg directly       to the skin and wash gently with scrungie or a clean washcloth.  5.  Apply the CHG Soap to your body ONLY FROM THE NECK DOWN.  Do not use on open wounds or open sores.  Avoid contact with your eyes,       ears, mouth and genitals (private parts).  Wash genitals (private parts)       with your normal soap.  6.  Wash thoroughly, paying special attention to the area where your surgery        will be performed.  7.  Thoroughly rinse your body with warm water from the neck down.  8.  DO NOT shower/wash with your normal soap after using and rinsing off       the CHG Soap.  9.  Pat yourself dry with a clean towel.            10.  Wear clean pajamas.            11.  Place clean sheets on your bed the night of your first shower and do not        sleep with pets.  Day of Surgery  Do not apply any lotions/deoderants the morning of surgery.  Please wear clean clothes to the hospital/surgery center.    Please read over the following fact sheets that you were given. MRSA Information

## 2016-09-29 ENCOUNTER — Encounter (HOSPITAL_COMMUNITY): Payer: Self-pay

## 2016-09-29 ENCOUNTER — Encounter (HOSPITAL_COMMUNITY)
Admission: RE | Admit: 2016-09-29 | Discharge: 2016-09-29 | Disposition: A | Discharge status: Home / Self Care | Payer: 59 | Source: Ambulatory Visit | Attending: Orthopedic Surgery | Admitting: Orthopedic Surgery

## 2016-09-29 DIAGNOSIS — Z01812 Encounter for preprocedural laboratory examination: Secondary | ICD-10-CM | POA: Insufficient documentation

## 2016-09-29 HISTORY — DX: Anxiety disorder, unspecified: F41.9

## 2016-09-29 LAB — BASIC METABOLIC PANEL
Anion gap: 13 (ref 5–15)
BUN: 16 mg/dL (ref 6–20)
CHLORIDE: 96 mmol/L — AB (ref 101–111)
CO2: 24 mmol/L (ref 22–32)
CREATININE: 1.01 mg/dL — AB (ref 0.44–1.00)
Calcium: 9.6 mg/dL (ref 8.9–10.3)
GFR calc Af Amer: >60 mL/min (ref >60)
GFR calc non Af Amer: 59 mL/min — ABNORMAL LOW (ref >60)
Glucose, Bld: 97 mg/dL (ref 65–99)
POTASSIUM: 3.1 mmol/L — AB (ref 3.5–5.1)
Sodium: 133 mmol/L — ABNORMAL LOW (ref 135–145)

## 2016-09-29 LAB — CBC
HEMATOCRIT: 31.1 % — AB (ref 36.0–46.0)
Hemoglobin: 10.5 g/dL — ABNORMAL LOW (ref 12.0–15.0)
MCH: 31.1 pg (ref 26.0–34.0)
MCHC: 33.8 g/dL (ref 30.0–36.0)
MCV: 92 fL (ref 78.0–100.0)
PLATELETS: 315 10*3/uL (ref 150–400)
RBC: 3.38 MIL/uL — ABNORMAL LOW (ref 3.87–5.11)
RDW: 13.9 % (ref 11.5–15.5)
WBC: 7.4 10*3/uL (ref 4.0–10.5)

## 2016-09-29 LAB — SURGICAL PCR SCREEN
MRSA, PCR: NEGATIVE
Staphylococcus aureus: NEGATIVE

## 2016-09-30 NOTE — H&P (Signed)
Felicia Burke is an 61 y.o. female.    Chief Complaint: right shoulder  HPI: Pt is a 61 y.o. female complaining of right shoulder pain for multiple years. Pain had continually increased since the beginning. X-rays in the clinic show end-stage arthritic changes of the right shoulder. Pt has tried various conservative treatments which have failed to alleviate their symptoms, including injections and therapy. Various options are discussed with the patient. Risks, benefits and expectations were discussed with the patient. Patient understand the risks, benefits and expectations and wishes to proceed with surgery.   PCP:  [name], MD  D/C Plans: Home  PMH: Past Medical History:  Diagnosis Date  . Anemia   . Anxiety   . Arthritis   . Asthma   . Basal cell carcinoma    x 5  . Depression   . Diverticulosis   . Gallstones   . GERD (gastroesophageal reflux disease)   . History of nocturia   . Hypertension   . Migraine   . Neuromuscular disorder ([place])    peripheral neuropathy  feet  . Non-alcoholic fatty liver disease   . Ovarian cyst   . Peripheral vascular disease ([place])   . Unspecified hemorrhoids with other complication   . Varicose veins     PSH: Past Surgical History:  Procedure Laterality Date  . BARIATRIC SURGERY    . BUNIONECTOMY     bilateral Left side redo  . CARPAL TUNNEL RELEASE Bilateral   . CHOLECYSTECTOMY    . CYSTOCELE REPAIR    . ENDOVENOUS ABLATION SAPHENOUS VEIN W/ LASER  01-22-2012   right greater saphenous vein and stab phlebectomy >20 incisions right leg    Sherren Mocha Early MD  . ENDOVENOUS ABLATION SAPHENOUS VEIN W/ LASER  02-26-2012   left greater saphenous vein and stab phlebectomy >20 left leg  . REVERSE SHOULDER ARTHROPLASTY Left 05/04/2015   Procedure: LEFT SHOULDER REVERSE TOTAL SHOULDER ARTHROPLASTY;  Surgeon: Netta Cedars, MD;  Location: [facility];  Service: Orthopedics;  Laterality: Left;  . SHOULDER ARTHROSCOPY WITH SUBACROMIAL DECOMPRESSION,  ROTATOR CUFF REPAIR AND BICEP TENDON REPAIR Left 05/08/2014   Procedure: LEFT SHOULDER ARTHROSCOPY WITH SUBACROMIAL DECOMPRESSION,OPEN ROTATOR CUFF REPAIR  OPEN BICEP TENODESIS,  OPEN SUBSCAPULAR REPAIR;  Surgeon: [name], MD;  Location: [facility];  Service: Orthopedics;  Laterality: Left;  Marland Kitchen VAGINAL HYSTERECTOMY      Social History:  reports that she has never smoked. She has never used smokeless tobacco. She reports that she does not drink alcohol or use drugs.  Allergies:  Allergies  Allergen Reactions  . Dilaudid [Hydromorphone Hcl] Itching    PO NARCOTICS cause itching   . Flagyl [Metronidazole] Itching    Medications: No current facility-administered medications for this encounter.    Current Outpatient Prescriptions  Medication Sig Dispense Refill  . acetaminophen (TYLENOL ARTHRITIS PAIN) 650 MG CR tablet Take 1,300 mg by mouth 2 (two) times daily.     Marland Kitchen amitriptyline (ELAVIL) 50 MG tablet Take 50 mg by mouth at bedtime.      Marland Kitchen amoxicillin (AMOXIL) 500 MG tablet Take 2,000 mg by mouth See admin instructions. Prior to dental procedures    . aspirin 81 MG tablet Take 81 mg by mouth every Monday, Wednesday, and Friday.     Marland Kitchen buPROPion (WELLBUTRIN XL) 300 MG 24 hr tablet Take 300 mg by mouth daily.    . busPIRone (BUSPAR) 10 MG tablet Take 10 mg by mouth 2 (two) times daily.    Marland Kitchen  Calcium Carbonate-Vitamin D (CALTRATE 600+D) 600-400 MG-UNIT per chew tablet Chew 2 tablets by mouth daily.     . Cholecalciferol (VITAMIN D3) 2000 UNITS TABS Take 1 tablet by mouth daily.      . diphenhydrAMINE (BENADRYL) 50 MG tablet Take 50 mg by mouth every 8 (eight) hours as needed for itching or sleep.    Marland Kitchen docusate sodium (COLACE) 100 MG capsule Take 100 mg by mouth 2 (two) times daily as needed (for constipation).     Marland Kitchen esomeprazole (NEXIUM) 20 MG capsule Take 20 mg by mouth daily.    Marland Kitchen gabapentin (NEURONTIN) 300 MG capsule Take 300-600 mg by mouth See admin instructions. 300 mg twice daily. 600  mg at bedtime    . hydrochlorothiazide (HYDRODIURIL) 25 MG tablet Take 25 mg by mouth every morning.    Marland Kitchen ibuprofen (ADVIL,MOTRIN) 200 MG tablet Take 800 mg by mouth every 8 (eight) hours as needed for mild pain or moderate pain.     Marland Kitchen lisinopril (PRINIVIL,ZESTRIL) 20 MG tablet Take 20 mg by mouth daily.    . mirtazapine (REMERON SOL-TAB) 30 MG disintegrating tablet Take 30 mg by mouth at bedtime.    . Multiple Vitamins-Minerals (MULTIVITAMIN GUMMIES WOMENS) CHEW Chew 2 each by mouth every morning.    . pantoprazole (PROTONIX) 40 MG tablet Take 40 mg by mouth 2 (two) times daily.    . potassium chloride (K-DUR) 10 MEQ tablet Take 10 mEq by mouth 3 (three) times a week. Mon Wed Fri    . sertraline (ZOLOFT) 100 MG tablet Take 200 mg by mouth daily.    . temazepam (RESTORIL) 30 MG capsule Take 30 mg by mouth at bedtime.    . traMADol (ULTRAM) 50 MG tablet Take 100 mg by mouth 3 (three) times daily.       No results found for this or any previous visit (from the past 48 hour(s)). No results found.  ROS: Pain with rom of the right upper extremity  Physical Exam:  Alert and oriented 61 y.o. female in no acute distress Cranial nerves 2-12 intact Cervical spine: full rom with no tenderness, nv intact distally Chest: active breath sounds bilaterally, no wheeze rhonchi or rales Heart: regular rate and rhythm, no murmur Abd: non tender non distended with active bowel sounds Hip is stable with rom  Right shoulder with limited rom and limited strength with ER and IR nv intact distally No rashes or edema  Assessment/Plan Assessment: right shoulder cuff arthropathy  Plan: Patient will undergo a right reverse total shoulder by Dr. Veverly Fells at [facility]. Risks benefits and expectations were discussed with the patient. Patient understand risks, benefits and expectations and wishes to proceed.

## 2016-09-30 NOTE — Progress Notes (Addendum)
Anesthesia Chart Review:  Pt is a 61 year old female scheduled for R reverse total shoulder arthroplasty in 10/10/2016 with Netta Cedars, M.D.  - PCP is Estell Harpin, NP  PMH includes:  HTN, PVD, anemia, asthma, non-alcoholic fatty liver disease, GERD. Never smoker. BMI 33. S/p L shoulder arthroplasty 05/04/15. S/p left shoulder arthroscopy 05/08/14.  Preoperative labs reviewed.  Will get LFTs DOS.   EKG requested.  If it does not arrive in time, EKG will be obtained DOS.   Willeen Cass, FNP-BC [facility]/Anesthesiology Phone: [phone] 09/30/2016 4:46 PM

## 2016-10-02 ENCOUNTER — Other Ambulatory Visit: Payer: Self-pay | Admitting: Obstetrics and Gynecology

## 2016-10-02 DIAGNOSIS — M8580 Other specified disorders of bone density and structure, unspecified site: Secondary | ICD-10-CM

## 2016-10-03 ENCOUNTER — Other Ambulatory Visit: Payer: Self-pay

## 2016-10-03 MED FILL — AMITRIPTYLINE HCL 50 MG TAB: 50 | 90 days supply | Qty: 90 | Fill #0

## 2016-10-03 NOTE — Patient Outreach (Signed)
[facility]) Care Management  10/03/2016  Felicia Burke 20-Feb-1956 [id number]   Subjective: Telephone call to patient regarding: UMR pre-op follow up.  Objective: per chart review- Patient to be admitted on 10/10/16 re: osteoarthritis, right shoulder.   Assessment: received UMR pre-op call referral on 10/02/16. 61 year old with right shoulder instability, right shoulder osteoarthritis. History of chronic venous insufficiency, history of left shoulder replacement. Noanswer. HIPPA compliant voice message left on both home and mobile number. Plan: If no return call, RNCM will follow up next week.  Thea Silversmith, RN, MSN, [place] Coordinator Cell: [phone]

## 2016-10-06 ENCOUNTER — Other Ambulatory Visit: Payer: Self-pay

## 2016-10-06 NOTE — Patient Outreach (Signed)
[facility]) Care Management  10/06/2016  Felicia Burke November 28, 1955 [id number]  Subjective: Telephone call to patient. Discussed UMR pre-op follow up. Patient voices understanding and agrees to pre-op call.    Objective: per chart review- Patient to be admitted on 10/10/16 for right shoulder arthroplasty.   Assessment: received UMR pre-op call referral on 10/02/16. Pre op call completed. 61 year old with history of Left shoulder replacement. Felicia Burke reports that her FMLA forms are completed. RNCM encouraged client to obtain a copy of the paperwork for her own files. Felicia Burke reports she has completed the Disability forms and is awaiting approval from [place]. She reports she will follow up regarding approval. Felicia Burke reports she is not sure if she chose the [place] plan as a benefit. RNCM encouraged her to check the website under "My benefits" or call the benefits office to confirm.     Post procedure equipment/home health needs- Felicia Burke states she had this procedure on her left shoulder and was discharged home with a sling during previous procedure. She reports she has an extra sling. RNCM reinforced that the inpatient case manager will arrange any equipment needs/home health needs if any, prior to discharge.  RNCM instructed Felicia Burke that [place] benefit is higher when using a [facility] facility/pharmacy. However, reinforced she has a choice of facility/agency. Felicia Burke stated that she is aware.  Support: Felicia Burke reports her husband and daughter are very supportive. She also adds that she has a supportive church. Her husband will take her to her follow up appointments and pick up any new medications from pharmacy. Felicia Burke reports understanding that she will be using a local pharmacy at discharge as she is scheduled for a weekend discharge. RNCM encouraged Felicia Burke if possible to get her new prescriptions filled at the pharmacy prior to leaving the hospital  (she has an hour drive to home).   Discussed Advanced directives- Felicia Burke reports she has an Advanced directive and will bring a copy to the hospital with her. RNCM reinforced it is a good idea for her doctor to have a copy also.  No other medical issues identified and no additional community resource information needs at this time.   Patient is agreeable to follow up post procedure call.  Plan: telephonic RNCM will follow up within 3 business days of notification.  Thea Silversmith, RN, MSN, [place] Coordinator Cell: [phone]

## 2016-10-07 DIAGNOSIS — F411 Generalized anxiety disorder: Secondary | ICD-10-CM | POA: Diagnosis not present

## 2016-10-07 DIAGNOSIS — J029 Acute pharyngitis, unspecified: Secondary | ICD-10-CM | POA: Diagnosis not present

## 2016-10-08 MED FILL — LISINOPRIL 20 MG TABLET: 20 | 90 days supply | Qty: 90 | Fill #3

## 2016-10-08 MED FILL — HYDROCHLOROTHIAZIDE 25 MG T: 25 | 90 days supply | Qty: 90 | Fill #2

## 2016-10-09 MED FILL — AMOXICILLIN 500 MG CAPSULE: 500 | 3 days supply | Qty: 12 | Fill #11

## 2016-10-09 MED FILL — BUPROPION HCL XL 300 MG TAB: 300 | 90 days supply | Qty: 90 | Fill #0

## 2016-10-10 ENCOUNTER — Inpatient Hospital Stay (HOSPITAL_COMMUNITY): Payer: 59 | Admitting: Emergency Medicine

## 2016-10-10 ENCOUNTER — Encounter (HOSPITAL_COMMUNITY)
Admission: RE | Disposition: A | Discharge status: Home / Self Care | Payer: Self-pay | Source: Ambulatory Visit | Attending: Orthopedic Surgery

## 2016-10-10 ENCOUNTER — Encounter (HOSPITAL_COMMUNITY): Payer: Self-pay | Admitting: *Deleted

## 2016-10-10 ENCOUNTER — Inpatient Hospital Stay (HOSPITAL_COMMUNITY): Payer: 59 | Admitting: Anesthesiology

## 2016-10-10 ENCOUNTER — Inpatient Hospital Stay (HOSPITAL_COMMUNITY): Payer: 59

## 2016-10-10 ENCOUNTER — Inpatient Hospital Stay (HOSPITAL_COMMUNITY)
Admission: RE | Admit: 2016-10-10 | Discharge: 2016-10-11 | DRG: 483 | Disposition: A | Discharge status: Home / Self Care | Payer: 59 | Source: Ambulatory Visit | Attending: Orthopedic Surgery | Admitting: Orthopedic Surgery

## 2016-10-10 DIAGNOSIS — Z90710 Acquired absence of both cervix and uterus: Secondary | ICD-10-CM | POA: Diagnosis not present

## 2016-10-10 DIAGNOSIS — I10 Essential (primary) hypertension: Secondary | ICD-10-CM | POA: Diagnosis present

## 2016-10-10 DIAGNOSIS — J45909 Unspecified asthma, uncomplicated: Secondary | ICD-10-CM | POA: Diagnosis present

## 2016-10-10 DIAGNOSIS — Z9889 Other specified postprocedural states: Secondary | ICD-10-CM | POA: Diagnosis not present

## 2016-10-10 DIAGNOSIS — Z79899 Other long term (current) drug therapy: Secondary | ICD-10-CM

## 2016-10-10 DIAGNOSIS — Z9049 Acquired absence of other specified parts of digestive tract: Secondary | ICD-10-CM | POA: Diagnosis not present

## 2016-10-10 DIAGNOSIS — Z7982 Long term (current) use of aspirin: Secondary | ICD-10-CM

## 2016-10-10 DIAGNOSIS — Z85828 Personal history of other malignant neoplasm of skin: Secondary | ICD-10-CM | POA: Diagnosis present

## 2016-10-10 DIAGNOSIS — M19011 Primary osteoarthritis, right shoulder: Secondary | ICD-10-CM | POA: Diagnosis not present

## 2016-10-10 DIAGNOSIS — M75101 Unspecified rotator cuff tear or rupture of right shoulder, not specified as traumatic: Secondary | ICD-10-CM | POA: Diagnosis not present

## 2016-10-10 DIAGNOSIS — Z888 Allergy status to other drugs, medicaments and biological substances status: Secondary | ICD-10-CM | POA: Diagnosis not present

## 2016-10-10 DIAGNOSIS — Z96611 Presence of right artificial shoulder joint: Secondary | ICD-10-CM | POA: Diagnosis not present

## 2016-10-10 DIAGNOSIS — M25311 Other instability, right shoulder: Secondary | ICD-10-CM | POA: Diagnosis not present

## 2016-10-10 DIAGNOSIS — I739 Peripheral vascular disease, unspecified: Secondary | ICD-10-CM | POA: Diagnosis present

## 2016-10-10 DIAGNOSIS — Z471 Aftercare following joint replacement surgery: Secondary | ICD-10-CM | POA: Diagnosis not present

## 2016-10-10 DIAGNOSIS — K219 Gastro-esophageal reflux disease without esophagitis: Secondary | ICD-10-CM | POA: Diagnosis present

## 2016-10-10 DIAGNOSIS — G8918 Other acute postprocedural pain: Secondary | ICD-10-CM | POA: Diagnosis not present

## 2016-10-10 HISTORY — PX: REVERSE SHOULDER ARTHROPLASTY: SHX5054

## 2016-10-10 SURGERY — ARTHROPLASTY, SHOULDER, TOTAL, REVERSE
Anesthesia: General | Site: Shoulder | Laterality: Right

## 2016-10-10 MED ORDER — PNEUMOCOCCAL VAC POLYVALENT 25 MCG/0.5ML IJ INJ
0.5000 mL | INJECTION | INTRAMUSCULAR | Status: AC
  Administered 2016-10-11: 0.5 mL via INTRAMUSCULAR
  Filled 2016-10-10: qty 0.5

## 2016-10-10 MED ORDER — PANTOPRAZOLE SODIUM 40 MG PO TBEC: 80.0000 mg | DELAYED_RELEASE_TABLET | Freq: Every day | ORAL | Status: DC

## 2016-10-10 MED ORDER — 0.9 % SODIUM CHLORIDE (POUR BTL) OPTIME
TOPICAL | Status: DC | PRN
  Administered 2016-10-10: 1000 mL

## 2016-10-10 MED ORDER — SERTRALINE HCL 100 MG PO TABS
200.0000 mg | ORAL_TABLET | Freq: Every day | ORAL | Status: DC
  Administered 2016-10-11: 200 mg via ORAL
  Filled 2016-10-10: qty 2

## 2016-10-10 MED ORDER — METHOCARBAMOL 500 MG PO TABS
500.0000 mg | ORAL_TABLET | Freq: Four times a day (QID) | ORAL | Status: DC | PRN
  Administered 2016-10-10 – 2016-10-11 (×2): 500 mg via ORAL
  Filled 2016-10-10 (×2): qty 1

## 2016-10-10 MED ORDER — PROMETHAZINE HCL 25 MG/ML IJ SOLN: 6.2500 mg | INTRAMUSCULAR | Status: DC | PRN

## 2016-10-10 MED ORDER — TRAMADOL HCL 50 MG PO TABS
100.0000 mg | ORAL_TABLET | Freq: Three times a day (TID) | ORAL | Status: DC
  Administered 2016-10-10 – 2016-10-11 (×2): 100 mg via ORAL
  Filled 2016-10-10 (×2): qty 2

## 2016-10-10 MED ORDER — MIDAZOLAM HCL 2 MG/2ML IJ SOLN
INTRAMUSCULAR | Status: AC
Start: 2016-10-10 — End: 2016-10-10
  Administered 2016-10-10: 1 mg via INTRAVENOUS
  Filled 2016-10-10: qty 2

## 2016-10-10 MED ORDER — CEFAZOLIN SODIUM-DEXTROSE 2-4 GM/100ML-% IV SOLN
2.0000 g | Freq: Four times a day (QID) | INTRAVENOUS | Status: AC
  Administered 2016-10-10 – 2016-10-11 (×2): 2 g via INTRAVENOUS
  Filled 2016-10-10 (×3): qty 100

## 2016-10-10 MED ORDER — DEXAMETHASONE SODIUM PHOSPHATE 10 MG/ML IJ SOLN
INTRAMUSCULAR | Status: DC | PRN
  Administered 2016-10-10: 10 mg via INTRAVENOUS

## 2016-10-10 MED ORDER — ADULT MULTIVITAMIN W/MINERALS CH
1.0000 | ORAL_TABLET | ORAL | Status: DC
  Administered 2016-10-11: 1 via ORAL
  Filled 2016-10-10: qty 1

## 2016-10-10 MED ORDER — POTASSIUM CHLORIDE ER 10 MEQ PO TBCR
10.0000 meq | EXTENDED_RELEASE_TABLET | ORAL | Status: DC
  Filled 2016-10-10: qty 1

## 2016-10-10 MED ORDER — FENTANYL CITRATE (PF) 100 MCG/2ML IJ SOLN
INTRAMUSCULAR | Status: AC
  Administered 2016-10-10: 75 ug via INTRAVENOUS
  Filled 2016-10-10: qty 2

## 2016-10-10 MED ORDER — SODIUM CHLORIDE 0.9 % IV SOLN: INTRAVENOUS | Status: DC

## 2016-10-10 MED ORDER — CALCIUM CARBONATE-VITAMIN D 500-200 MG-UNIT PO TABS
2.0000 | ORAL_TABLET | Freq: Every day | ORAL | Status: DC
  Administered 2016-10-11: 09:00:00 2 via ORAL
  Filled 2016-10-10: qty 2

## 2016-10-10 MED ORDER — PHENYLEPHRINE HCL 10 MG/ML IJ SOLN
INTRAVENOUS | Status: DC | PRN
  Administered 2016-10-10: 50 ug/min via INTRAVENOUS

## 2016-10-10 MED ORDER — GABAPENTIN 300 MG PO CAPS
600.0000 mg | ORAL_CAPSULE | Freq: Every day | ORAL | Status: DC
  Administered 2016-10-10: 600 mg via ORAL
  Filled 2016-10-10: qty 2

## 2016-10-10 MED ORDER — TEMAZEPAM 30 MG PO CAPS
30.0000 mg | ORAL_CAPSULE | Freq: Every day | ORAL | Status: DC
  Administered 2016-10-10: 30 mg via ORAL
  Filled 2016-10-10: qty 2

## 2016-10-10 MED ORDER — ONDANSETRON HCL 4 MG/2ML IJ SOLN: 4.0000 mg | Freq: Four times a day (QID) | INTRAMUSCULAR | Status: DC | PRN

## 2016-10-10 MED ORDER — DEXAMETHASONE SODIUM PHOSPHATE 10 MG/ML IJ SOLN
INTRAMUSCULAR | Status: AC
  Filled 2016-10-10: qty 1

## 2016-10-10 MED ORDER — ASPIRIN EC 81 MG PO TBEC: 81.0000 mg | DELAYED_RELEASE_TABLET | ORAL | Status: DC

## 2016-10-10 MED ORDER — BISACODYL 10 MG RE SUPP: 10.0000 mg | Freq: Every day | RECTAL | Status: DC | PRN

## 2016-10-10 MED ORDER — VITAMIN D 1000 UNITS PO TABS
2000.0000 [IU] | ORAL_TABLET | Freq: Every day | ORAL | Status: DC
  Administered 2016-10-11: 2000 [IU] via ORAL
  Filled 2016-10-10 (×3): qty 2

## 2016-10-10 MED ORDER — LISINOPRIL 20 MG PO TABS
20.0000 mg | ORAL_TABLET | Freq: Every day | ORAL | Status: DC
  Administered 2016-10-11: 20 mg via ORAL
  Filled 2016-10-10: qty 1

## 2016-10-10 MED ORDER — FENTANYL CITRATE (PF) 100 MCG/2ML IJ SOLN
INTRAMUSCULAR | Status: AC
  Filled 2016-10-10: qty 2

## 2016-10-10 MED ORDER — EPINEPHRINE PF 1 MG/ML IJ SOLN
INTRAMUSCULAR | Status: AC
  Filled 2016-10-10: qty 1

## 2016-10-10 MED ORDER — ONDANSETRON HCL 4 MG/2ML IJ SOLN
INTRAMUSCULAR | Status: DC | PRN
  Administered 2016-10-10: 4 mg via INTRAVENOUS

## 2016-10-10 MED ORDER — METOCLOPRAMIDE HCL 5 MG PO TABS: 5.0000 mg | ORAL_TABLET | Freq: Three times a day (TID) | ORAL | Status: DC | PRN

## 2016-10-10 MED ORDER — ACETAMINOPHEN 650 MG RE SUPP: 650.0000 mg | Freq: Four times a day (QID) | RECTAL | Status: DC | PRN

## 2016-10-10 MED ORDER — ONDANSETRON HCL 4 MG PO TABS: 4.0000 mg | ORAL_TABLET | Freq: Four times a day (QID) | ORAL | Status: DC | PRN

## 2016-10-10 MED ORDER — DOCUSATE SODIUM 100 MG PO CAPS: 100.0000 mg | ORAL_CAPSULE | Freq: Two times a day (BID) | ORAL | Status: DC | PRN

## 2016-10-10 MED ORDER — ACETAMINOPHEN ER 650 MG PO TBCR: 1300.0000 mg | EXTENDED_RELEASE_TABLET | Freq: Two times a day (BID) | ORAL | Status: DC

## 2016-10-10 MED ORDER — MIRTAZAPINE 30 MG PO TBDP
30.0000 mg | ORAL_TABLET | Freq: Every day | ORAL | Status: DC
  Administered 2016-10-10: 30 mg via ORAL
  Filled 2016-10-10: qty 1

## 2016-10-10 MED ORDER — ONDANSETRON HCL 4 MG/2ML IJ SOLN
INTRAMUSCULAR | Status: AC
  Filled 2016-10-10: qty 2

## 2016-10-10 MED ORDER — BUPIVACAINE-EPINEPHRINE 0.25% -1:200000 IJ SOLN
INTRAMUSCULAR | Status: DC | PRN
  Administered 2016-10-10: 8 mL

## 2016-10-10 MED ORDER — METOCLOPRAMIDE HCL 5 MG/ML IJ SOLN: 5.0000 mg | Freq: Three times a day (TID) | INTRAMUSCULAR | Status: DC | PRN

## 2016-10-10 MED ORDER — LACTATED RINGERS IV SOLN
INTRAVENOUS | Status: DC | PRN
  Administered 2016-10-10: 10:00:00 via INTRAVENOUS

## 2016-10-10 MED ORDER — MORPHINE SULFATE (PF) 4 MG/ML IV SOLN: 1.0000 mg | INTRAVENOUS | Status: DC | PRN

## 2016-10-10 MED ORDER — FENTANYL CITRATE (PF) 100 MCG/2ML IJ SOLN
INTRAMUSCULAR | Status: DC | PRN
  Administered 2016-10-10: 50 ug via INTRAVENOUS

## 2016-10-10 MED ORDER — EPHEDRINE 5 MG/ML INJ
INTRAVENOUS | Status: AC
  Filled 2016-10-10: qty 10

## 2016-10-10 MED ORDER — BUPIVACAINE-EPINEPHRINE (PF) 0.5% -1:200000 IJ SOLN
INTRAMUSCULAR | Status: DC | PRN
  Administered 2016-10-10: 25 mL via PERINEURAL

## 2016-10-10 MED ORDER — POLYETHYLENE GLYCOL 3350 17 G PO PACK: 17.0000 g | PACK | Freq: Every day | ORAL | Status: DC | PRN

## 2016-10-10 MED ORDER — BUSPIRONE HCL 10 MG PO TABS
10.0000 mg | ORAL_TABLET | Freq: Two times a day (BID) | ORAL | Status: DC
Start: 2016-10-10 — End: 2016-10-11
  Administered 2016-10-10 – 2016-10-11 (×2): 10 mg via ORAL
  Filled 2016-10-10 (×2): qty 1

## 2016-10-10 MED ORDER — IBUPROFEN 800 MG PO TABS
800.0000 mg | ORAL_TABLET | Freq: Three times a day (TID) | ORAL | Status: DC | PRN
  Filled 2016-10-10: qty 1

## 2016-10-10 MED ORDER — ROCURONIUM BROMIDE 50 MG/5ML IV SOSY
PREFILLED_SYRINGE | INTRAVENOUS | Status: AC
  Filled 2016-10-10: qty 5

## 2016-10-10 MED ORDER — BUPROPION HCL ER (XL) 300 MG PO TB24
300.0000 mg | ORAL_TABLET | Freq: Every day | ORAL | Status: DC
  Administered 2016-10-11: 300 mg via ORAL
  Filled 2016-10-10: qty 1

## 2016-10-10 MED ORDER — SUGAMMADEX SODIUM 200 MG/2ML IV SOLN
INTRAVENOUS | Status: DC | PRN
  Administered 2016-10-10: 200 mg via INTRAVENOUS

## 2016-10-10 MED ORDER — HYDROCODONE-ACETAMINOPHEN 5-325 MG PO TABS: 1.0000 | ORAL_TABLET | Freq: Four times a day (QID) | ORAL | 0 refills | Status: AC | PRN

## 2016-10-10 MED ORDER — LACTATED RINGERS IV SOLN: INTRAVENOUS | Status: DC | PRN

## 2016-10-10 MED ORDER — AMITRIPTYLINE HCL 50 MG PO TABS
50.0000 mg | ORAL_TABLET | Freq: Every day | ORAL | Status: DC
  Administered 2016-10-10: 50 mg via ORAL
  Filled 2016-10-10: qty 1

## 2016-10-10 MED ORDER — HYDROCHLOROTHIAZIDE 25 MG PO TABS
25.0000 mg | ORAL_TABLET | ORAL | Status: DC
  Administered 2016-10-11: 25 mg via ORAL
  Filled 2016-10-10: qty 1

## 2016-10-10 MED ORDER — LIDOCAINE HCL (CARDIAC) 20 MG/ML IV SOLN
INTRAVENOUS | Status: DC | PRN
  Administered 2016-10-10 (×2): 40 mg via INTRAVENOUS

## 2016-10-10 MED ORDER — METHOCARBAMOL 1000 MG/10ML IJ SOLN
500.0000 mg | Freq: Four times a day (QID) | INTRAVENOUS | Status: DC | PRN
  Filled 2016-10-10: qty 5

## 2016-10-10 MED ORDER — PROPOFOL 10 MG/ML IV BOLUS
INTRAVENOUS | Status: DC | PRN
Start: 2016-10-10 — End: 2016-10-10
  Administered 2016-10-10: 170 mg via INTRAVENOUS

## 2016-10-10 MED ORDER — PANTOPRAZOLE SODIUM 40 MG PO TBEC
40.0000 mg | DELAYED_RELEASE_TABLET | Freq: Two times a day (BID) | ORAL | Status: DC
  Administered 2016-10-10 – 2016-10-11 (×2): 40 mg via ORAL
  Filled 2016-10-10 (×2): qty 1

## 2016-10-10 MED ORDER — PHENOL 1.4 % MT LIQD: 1.0000 | OROMUCOSAL | Status: DC | PRN

## 2016-10-10 MED ORDER — LIDOCAINE 2% (20 MG/ML) 5 ML SYRINGE
INTRAMUSCULAR | Status: AC
  Filled 2016-10-10: qty 5

## 2016-10-10 MED ORDER — DIPHENHYDRAMINE HCL 25 MG PO TABS
25.0000 mg | ORAL_TABLET | Freq: Three times a day (TID) | ORAL | Status: DC | PRN
  Administered 2016-10-10 – 2016-10-11 (×2): 25 mg via ORAL
  Filled 2016-10-10 (×2): qty 2
  Filled 2016-10-10: qty 1
  Filled 2016-10-10: qty 2
  Filled 2016-10-10: qty 1

## 2016-10-10 MED ORDER — ROCURONIUM BROMIDE 100 MG/10ML IV SOLN
INTRAVENOUS | Status: DC | PRN
  Administered 2016-10-10: 40 mg via INTRAVENOUS

## 2016-10-10 MED ORDER — MORPHINE SULFATE (PF) 2 MG/ML IV SOLN
2.0000 mg | INTRAVENOUS | Status: DC | PRN
  Administered 2016-10-10: 2 mg via INTRAVENOUS
  Filled 2016-10-10: qty 1

## 2016-10-10 MED ORDER — PROPOFOL 10 MG/ML IV BOLUS
INTRAVENOUS | Status: AC
  Filled 2016-10-10: qty 20

## 2016-10-10 MED ORDER — EPHEDRINE SULFATE-NACL 50-0.9 MG/10ML-% IV SOSY
PREFILLED_SYRINGE | INTRAVENOUS | Status: DC | PRN
  Administered 2016-10-10: 5 mg via INTRAVENOUS
  Administered 2016-10-10: 10 mg via INTRAVENOUS
  Administered 2016-10-10: 5 mg via INTRAVENOUS
  Administered 2016-10-10: 10 mg via INTRAVENOUS

## 2016-10-10 MED ORDER — FENTANYL CITRATE (PF) 100 MCG/2ML IJ SOLN
75.0000 ug | Freq: Once | INTRAMUSCULAR | Status: AC
  Administered 2016-10-10: 75 ug via INTRAVENOUS

## 2016-10-10 MED ORDER — CEFAZOLIN SODIUM-DEXTROSE 2-4 GM/100ML-% IV SOLN
2.0000 g | INTRAVENOUS | Status: AC
  Administered 2016-10-10: 2 g via INTRAVENOUS

## 2016-10-10 MED ORDER — DOCUSATE SODIUM 100 MG PO CAPS
100.0000 mg | ORAL_CAPSULE | Freq: Two times a day (BID) | ORAL | Status: DC
  Administered 2016-10-10 – 2016-10-11 (×2): 100 mg via ORAL
  Filled 2016-10-10 (×2): qty 1

## 2016-10-10 MED ORDER — ACETAMINOPHEN 325 MG PO TABS: 650.0000 mg | ORAL_TABLET | Freq: Four times a day (QID) | ORAL | Status: DC | PRN

## 2016-10-10 MED ORDER — SUGAMMADEX SODIUM 200 MG/2ML IV SOLN
INTRAVENOUS | Status: AC
  Filled 2016-10-10: qty 2

## 2016-10-10 MED ORDER — HYDROCODONE-ACETAMINOPHEN 5-325 MG PO TABS
1.0000 | ORAL_TABLET | ORAL | Status: DC | PRN
  Administered 2016-10-10 – 2016-10-11 (×3): 2 via ORAL
  Filled 2016-10-10 (×3): qty 2

## 2016-10-10 MED ORDER — METHOCARBAMOL 500 MG PO TABS: 500.0000 mg | ORAL_TABLET | Freq: Three times a day (TID) | ORAL | 1 refills | Status: AC | PRN

## 2016-10-10 MED ORDER — CHLORHEXIDINE GLUCONATE 4 % EX LIQD: 60.0000 mL | Freq: Once | CUTANEOUS | Status: DC

## 2016-10-10 MED ORDER — CEFAZOLIN SODIUM-DEXTROSE 2-4 GM/100ML-% IV SOLN
INTRAVENOUS | Status: AC
  Filled 2016-10-10: qty 100

## 2016-10-10 MED ORDER — MIDAZOLAM HCL 2 MG/2ML IJ SOLN
1.0000 mg | Freq: Once | INTRAMUSCULAR | Status: AC
  Administered 2016-10-10: 1 mg via INTRAVENOUS

## 2016-10-10 MED ORDER — BUPIVACAINE HCL (PF) 0.25 % IJ SOLN
INTRAMUSCULAR | Status: AC
  Filled 2016-10-10: qty 30

## 2016-10-10 MED ORDER — MENTHOL 3 MG MT LOZG: 1.0000 | LOZENGE | OROMUCOSAL | Status: DC | PRN

## 2016-10-10 MED ORDER — GABAPENTIN 300 MG PO CAPS
300.0000 mg | ORAL_CAPSULE | Freq: Two times a day (BID) | ORAL | Status: DC
  Administered 2016-10-11: 300 mg via ORAL
  Filled 2016-10-10: qty 1

## 2016-10-10 MED ORDER — MIDAZOLAM HCL 2 MG/2ML IJ SOLN
INTRAMUSCULAR | Status: AC
  Filled 2016-10-10: qty 2

## 2016-10-10 SURGICAL SUPPLY — 74 items
BIT DRILL 170X2.5X (BIT) IMPLANT
BIT DRILL 5/64X5 DISP (BIT) IMPLANT
BIT DRL 170X2.5X (BIT)
BLADE SAG 18X100X1.27 (BLADE) ×2 IMPLANT
BOWL SMART MIX CTS (DISPOSABLE) ×2 IMPLANT
CAPT SHLDR REVTOTAL 1 ×2 IMPLANT
CEMENT HV SMART SET (Cement) ×2 IMPLANT
COVER SURGICAL LIGHT HANDLE (MISCELLANEOUS) ×2 IMPLANT
DRAPE IMP U-DRAPE 54X76 (DRAPES) ×4 IMPLANT
DRAPE INCISE IOBAN 66X45 STRL (DRAPES) ×2 IMPLANT
DRAPE ORTHO SPLIT 77X108 STRL (DRAPES) ×2
DRAPE SURG ORHT 6 SPLT 77X108 (DRAPES) ×2 IMPLANT
DRAPE U-SHAPE 47X51 STRL (DRAPES) ×2 IMPLANT
DRAPE X-RAY CASS 24X20 (DRAPES) IMPLANT
DRILL 2.5 (BIT)
DRSG ADAPTIC 3X8 NADH LF (GAUZE/BANDAGES/DRESSINGS) ×2 IMPLANT
DRSG PAD ABDOMINAL 8X10 ST (GAUZE/BANDAGES/DRESSINGS) ×2 IMPLANT
DURAPREP 26ML APPLICATOR (WOUND CARE) ×2 IMPLANT
ELECT BLADE 4.0 EZ CLEAN MEGAD (MISCELLANEOUS) ×2
ELECT NEEDLE TIP 2.8 STRL (NEEDLE) ×2 IMPLANT
ELECT REM PT RETURN 9FT ADLT (ELECTROSURGICAL) ×2
ELECTRODE BLDE 4.0 EZ CLN MEGD (MISCELLANEOUS) ×1 IMPLANT
ELECTRODE REM PT RTRN 9FT ADLT (ELECTROSURGICAL) ×1 IMPLANT
FILTER STRAW FLUID ASPIR (MISCELLANEOUS) ×2 IMPLANT
GAUZE SPONGE 4X4 12PLY STRL (GAUZE/BANDAGES/DRESSINGS) ×2 IMPLANT
GLOVE BIOGEL PI IND STRL 7.5 (GLOVE) ×1 IMPLANT
GLOVE BIOGEL PI INDICATOR 7.5 (GLOVE) ×1
GLOVE BIOGEL PI ORTHO PRO 7.5 (GLOVE) ×1
GLOVE BIOGEL PI ORTHO PRO SZ8 (GLOVE) ×1
GLOVE ORTHO TXT STRL SZ7.5 (GLOVE) ×2 IMPLANT
GLOVE PI ORTHO PRO STRL 7.5 (GLOVE) ×1 IMPLANT
GLOVE PI ORTHO PRO STRL SZ8 (GLOVE) ×1 IMPLANT
GLOVE SURG ORTHO 8.5 STRL (GLOVE) ×2 IMPLANT
GLOVE SURG SS PI 6.5 STRL IVOR (GLOVE) ×2 IMPLANT
GOWN STRL REUS W/ TWL LRG LVL3 (GOWN DISPOSABLE) ×1 IMPLANT
GOWN STRL REUS W/ TWL XL LVL3 (GOWN DISPOSABLE) ×2 IMPLANT
GOWN STRL REUS W/TWL LRG LVL3 (GOWN DISPOSABLE) ×1
GOWN STRL REUS W/TWL XL LVL3 (GOWN DISPOSABLE) ×2
HANDPIECE INTERPULSE COAX TIP (DISPOSABLE)
KIT BASIN OR (CUSTOM PROCEDURE TRAY) ×2 IMPLANT
KIT ROOM TURNOVER OR (KITS) ×2 IMPLANT
MANIFOLD NEPTUNE II (INSTRUMENTS) ×2 IMPLANT
NEEDLE 1/2 CIR MAYO (NEEDLE) ×2 IMPLANT
NEEDLE 22X1 1/2 (OR ONLY) (NEEDLE) ×2 IMPLANT
NEEDLE HYPO 25GX1X1/2 BEV (NEEDLE) ×2 IMPLANT
NS IRRIG 1000ML POUR BTL (IV SOLUTION) ×2 IMPLANT
PACK SHOULDER (CUSTOM PROCEDURE TRAY) ×2 IMPLANT
PAD ARMBOARD 7.5X6 YLW CONV (MISCELLANEOUS) ×4 IMPLANT
PIN GUIDE 1.2 (PIN) IMPLANT
PIN GUIDE GLENOPHERE 1.5MX300M (PIN) IMPLANT
PIN METAGLENE 2.5 (PIN) IMPLANT
SET HNDPC FAN SPRY TIP SCT (DISPOSABLE) IMPLANT
SLING ARM LRG ADULT FOAM STRAP (SOFTGOODS) ×2 IMPLANT
SLING ARM MED ADULT FOAM STRAP (SOFTGOODS) IMPLANT
SPONGE LAP 18X18 X RAY DECT (DISPOSABLE) IMPLANT
SPONGE LAP 4X18 X RAY DECT (DISPOSABLE) ×2 IMPLANT
STRIP CLOSURE SKIN 1/2X4 (GAUZE/BANDAGES/DRESSINGS) ×2 IMPLANT
SUCTION FRAZIER HANDLE 10FR (MISCELLANEOUS) ×1
SUCTION TUBE FRAZIER 10FR DISP (MISCELLANEOUS) ×1 IMPLANT
SUT FIBERWIRE #2 38 T-5 BLUE (SUTURE) ×12
SUT MNCRL AB 4-0 PS2 18 (SUTURE) ×2 IMPLANT
SUT VIC AB 0 CT2 27 (SUTURE) ×2 IMPLANT
SUT VIC AB 2-0 CT1 27 (SUTURE) ×1
SUT VIC AB 2-0 CT1 TAPERPNT 27 (SUTURE) ×1 IMPLANT
SUT VICRYL 0 CT 1 36IN (SUTURE) ×2 IMPLANT
SUTURE FIBERWR #2 38 T-5 BLUE (SUTURE) ×6 IMPLANT
SYR CONTROL 10ML LL (SYRINGE) ×2 IMPLANT
SYR TB 1ML LUER SLIP (SYRINGE) ×2 IMPLANT
TOWEL OR 17X24 6PK STRL BLUE (TOWEL DISPOSABLE) ×2 IMPLANT
TOWEL OR 17X26 10 PK STRL BLUE (TOWEL DISPOSABLE) ×2 IMPLANT
TOWER CARTRIDGE SMART MIX (DISPOSABLE) IMPLANT
TRAY FOLEY CATH 16FRSI W/METER (SET/KITS/TRAYS/PACK) IMPLANT
WATER STERILE IRR 1000ML POUR (IV SOLUTION) ×2 IMPLANT
YANKAUER SUCT BULB TIP NO VENT (SUCTIONS) ×2 IMPLANT

## 2016-10-10 NOTE — Anesthesia Procedure Notes (Addendum)
Anesthesia Regional Block:  Interscalene brachial plexus block  Pre-Anesthetic Checklist: ,, timeout performed, Correct Patient, Correct Site, Correct Laterality, Correct Procedure, Correct Position, site marked, Risks and benefits discussed,  Surgical consent,  Pre-op evaluation,  At surgeon's request and post-op pain management  Laterality: Upper and Right  Prep: Betadine, chloraprep       Needles:  Injection technique: Single-shot  Needle Type: Echogenic Stimulator Needle     Needle Length: 9cm 9 cm Needle Gauge: 22 and 22 G  Needle insertion depth: 4 cm   Additional Needles:  Procedures: ultrasound guided (picture in chart) and nerve stimulator Interscalene brachial plexus block  Nerve Stimulator or Paresthesia:  Response: Twitch elicited, 0.5 mA, 0.3 ms,   Additional Responses:   Narrative:  Start time: 10/10/2016 9:40 AM End time: 10/10/2016 9:54 AM Injection made incrementally with aspirations every 5 mL.  Performed by: Personally  Anesthesiologist: Laurier Jasperson  Additional Notes: Block assessed prior to start of surgery

## 2016-10-10 NOTE — Brief Op Note (Signed)
10/10/2016  12:30 PM  PATIENT:  Felicia Burke  61 y.o. female  PRE-OPERATIVE DIAGNOSIS:  Right shoulder osteoarthritis, Rotator cuff insufficiency  POST-OPERATIVE DIAGNOSIS:  Right shoulder osteoarthritis, Rotator cuff insufficiency  PROCEDURE:  Procedure(s): Right shoulder reverse total shoulder arthroplasty (Right) DePuy Delta Xtend  SURGEON:  Surgeon(s) and Role:    * Netta Cedars, MD - Primary  PHYSICIAN ASSISTANT:   ASSISTANTS:  Ventura Bruns, PA-C   ANESTHESIA:   regional and general  EBL:  Total I/O In: 800 [I.V.:800] Out: 100 [Blood:100]  BLOOD ADMINISTERED:none  DRAINS: none   LOCAL MEDICATIONS USED:  MARCAINE     SPECIMEN:  No Specimen  DISPOSITION OF SPECIMEN:  N/A  COUNTS:  YES  TOURNIQUET:  * No tourniquets in log *  DICTATION: .Other Dictation: Dictation Number [id number]  PLAN OF CARE: Admit to inpatient   PATIENT DISPOSITION:  PACU - hemodynamically stable.   Delay start of Pharmacological VTE agent (>24hrs) due to surgical blood loss or risk of bleeding: no

## 2016-10-10 NOTE — Discharge Instructions (Signed)
Ice to the shoulder as much as possible.  Keep the incision clean and dry and covered for one week, then ok to get it wet in the shower. Wear the sling when out of the house.  Keep pillow behind the elbow to keep the arm across your waist.  Do exercises 4-5 times per day as instructed.  Do not push pull or lift with the arm.  Follow up in two weeks in the office [phone]

## 2016-10-10 NOTE — Anesthesia Procedure Notes (Addendum)
Procedure Name: Intubation Date/Time: 10/10/2016 10:17 AM Performed by: Kyung Rudd Pre-anesthesia Checklist: Patient identified, Emergency Drugs available, Suction available and Patient being monitored Patient Re-evaluated:Patient Re-evaluated prior to inductionOxygen Delivery Method: Circle system utilized Preoxygenation: Pre-oxygenation with 100% oxygen Intubation Type: IV induction Ventilation: Mask ventilation without difficulty Laryngoscope Size: Mac and 4 Grade View: Grade I Tube type: Oral Tube size: 7.0 mm Number of attempts: 1 Airway Equipment and Method: Stylet and LTA kit utilized Placement Confirmation: ETT inserted through vocal cords under direct vision,  positive ETCO2 and breath sounds checked- equal and bilateral Secured at: 21 cm Tube secured with: Tape Dental Injury: Teeth and Oropharynx as per pre-operative assessment

## 2016-10-10 NOTE — Anesthesia Preprocedure Evaluation (Addendum)
Anesthesia Evaluation  Patient identified by MRN, date of birth, ID band Patient awake    Reviewed: Allergy & Precautions, NPO status , Patient's Chart, lab work & pertinent test results  History of Anesthesia Complications Negative for: history of anesthetic complications  Airway Mallampati: II  TM Distance: <3 FB Neck ROM: Full    Dental  (+) Teeth Intact, Dental Advisory Given,    Pulmonary asthma ,    breath sounds clear to auscultation       Cardiovascular hypertension, Pt. on medications + Peripheral Vascular Disease   Rhythm:Regular Rate:Normal     Neuro/Psych    GI/Hepatic Neg liver ROS, GERD  ,  Endo/Other    Renal/GU negative Renal ROS     Musculoskeletal  (+) Arthritis ,   Abdominal   Peds  Hematology   Anesthesia Other Findings   Reproductive/Obstetrics                           Anesthesia Physical Anesthesia Plan  ASA: II  Anesthesia Plan: General   Post-op Pain Management:  Regional for Post-op pain   Induction: Intravenous  Airway Management Planned: Oral ETT  Additional Equipment:   Intra-op Plan:   Post-operative Plan: Extubation in OR  Informed Consent: I have reviewed the patients History and Physical, chart, labs and discussed the procedure including the risks, benefits and alternatives for the proposed anesthesia with the patient or authorized representative who has indicated his/her understanding and acceptance.   Dental advisory given  Plan Discussed with: CRNA  Anesthesia Plan Comments:         Anesthesia Quick Evaluation

## 2016-10-10 NOTE — Transfer of Care (Signed)
Immediate Anesthesia Transfer of Care Note  Patient: Felicia Burke  Procedure(s) Performed: Procedure(s): Right shoulder reverse total shoulder arthroplasty (Right)  Patient Location: PACU  Anesthesia Type:GA combined with regional for post-op pain  Level of Consciousness: awake, alert  and oriented  Airway & Oxygen Therapy: Patient Spontanous Breathing and Patient connected to nasal cannula oxygen  Post-op Assessment: Report given to RN, Post -op Vital signs reviewed and stable and Patient moving all extremities  Post vital signs: Reviewed and stable  Last Vitals:  Vitals:   10/10/16 0929 10/10/16 0940  BP: (!) 166/74 (!) 166/70  Pulse: 76   Resp: 18   Temp: 36.7 C     Last Pain:  Vitals:   10/10/16 0959  TempSrc:   PainSc: 0-No pain         Complications: No apparent anesthesia complications

## 2016-10-10 NOTE — Interval H&P Note (Signed)
History and Physical Interval Note:  10/10/2016 9:36 AM  Felicia Burke  has presented today for surgery, with the diagnosis of Right shoulder osteoarthritis, Rotator cuff insufficiency  The various methods of treatment have been discussed with the patient and family. After consideration of risks, benefits and other options for treatment, the patient has consented to  Procedure(s): Right shoulder reverse total shoulder arthroplasty (Right) as a surgical intervention .  The patient's history has been reviewed, patient examined, no change in status, stable for surgery.  I have reviewed the patient's chart and labs.  Questions were answered to the patient's satisfaction.     Kavion Mancinas,STEVEN R

## 2016-10-11 LAB — BASIC METABOLIC PANEL
ANION GAP: 8 (ref 5–15)
BUN: 14 mg/dL (ref 6–20)
CALCIUM: 8.9 mg/dL (ref 8.9–10.3)
CO2: 27 mmol/L (ref 22–32)
Chloride: 102 mmol/L (ref 101–111)
Creatinine, Ser: 0.6 mg/dL (ref 0.44–1.00)
Glucose, Bld: 106 mg/dL — ABNORMAL HIGH (ref 65–99)
POTASSIUM: 3.5 mmol/L (ref 3.5–5.1)
SODIUM: 137 mmol/L (ref 135–145)

## 2016-10-11 LAB — HEMOGLOBIN AND HEMATOCRIT, BLOOD
HCT: 28.3 % — ABNORMAL LOW (ref 36.0–46.0)
Hemoglobin: 9.2 g/dL — ABNORMAL LOW (ref 12.0–15.0)

## 2016-10-11 MED ORDER — MUPIROCIN 2 % EX OINT
TOPICAL_OINTMENT | Freq: Two times a day (BID) | CUTANEOUS | Status: DC
  Administered 2016-10-11: 1 via NASAL
  Filled 2016-10-11: qty 22

## 2016-10-11 MED ORDER — DIPHENHYDRAMINE HCL 25 MG PO CAPS: 25.0000 mg | ORAL_CAPSULE | Freq: Three times a day (TID) | ORAL | Status: DC | PRN

## 2016-10-11 MED ORDER — MUPIROCIN 2 % EX OINT: TOPICAL_OINTMENT | Freq: Two times a day (BID) | CUTANEOUS | 0 refills | Status: DC

## 2016-10-11 MED ORDER — MUPIROCIN 2 % EX OINT: TOPICAL_OINTMENT | Freq: Two times a day (BID) | CUTANEOUS | 0 refills | Status: AC

## 2016-10-11 NOTE — Evaluation (Signed)
Occupational Therapy Evaluation Patient Details Name: Felicia Burke MRN: [id number] DOB: [birth year] Today's Date: 10/11/2016    History of Present Illness Pt is a 61 y.o. female s/p R reverse TSA. PMHx: Anxiety, Arthritis, Basal cell carcinoma, Depression, GERD, HTN, Peripheral neuropathy, Peripheral vascular disease, L reverse TSA 2016.   Clinical Impression   Pt reports she was independent with ADL PTA. Currently pt overall supervision for functional mobility and min assist for ADL. Shoulder, ADL, and safety education completed with pt and daughter. Pt planning to d/c home with 24/7 supervision from family. Pt ready for d/c from OT standpoint but will continue to follow acutely.    Follow Up Recommendations  No OT follow up;Supervision/Assistance - 24 hour (follow up per MD)    Equipment Recommendations  None recommended by OT    Recommendations for Other Services       Precautions / Restrictions Precautions Precautions: Shoulder Type of Shoulder Precautions: Conservative protocol: Lap slides/pendulums OK. NO A/PROM shoulder. AROM elbow/wrist/hand OK.  Shoulder Interventions: Shoulder sling/immobilizer;At all times;Off for dressing/bathing/exercises Precaution Booklet Issued: Yes (comment) Precaution Comments: Educated pt and daughter on shoulder precautions. Required Braces or Orthoses: Sling Restrictions Weight Bearing Restrictions: Yes RUE Weight Bearing: Non weight bearing      Mobility Bed Mobility Overal bed mobility: Modified Independent             General bed mobility comments: HOB elevated  Transfers Overall transfer level: Needs assistance Equipment used: None Transfers: Sit to/from Stand Sit to Stand: Supervision         General transfer comment: For safety     Balance Overall balance assessment: No apparent balance deficits (not formally assessed)                                          ADL Overall ADL's : Needs  assistance/impaired Eating/Feeding: Set up;Sitting   Grooming: Supervision/safety;Standing   Upper Body Bathing: Minimal assistance;Sitting Upper Body Bathing Details (indicate cue type and reason): Educated on UB bathing technique Lower Body Bathing: Supervison/ safety;Sit to/from stand   Upper Body Dressing : Minimal assistance;Sitting Upper Body Dressing Details (indicate cue type and reason): Educated on UB dressing technique Lower Body Dressing: Minimal assistance;Sit to/from stand Lower Body Dressing Details (indicate cue type and reason): to don shoes Toilet Transfer: Supervision/safety;Ambulation;Regular Glass blower/designer Details (indicate cue type and reason): Simulated by sit to stand from EOB with functional mobility in room         Functional mobility during ADLs: Supervision/safety General ADL Comments: Educated pt on proper positioning of RUE in bed/chair, sling management and wear schedule, ice for edema and pain, RUE NWB, RUE exercises.     Vision     Perception     Praxis      Pertinent Vitals/Pain Pain Assessment: 0-10 Pain Score: 6  Pain Location: R shoulder Pain Descriptors / Indicators: Aching;Sore Pain Intervention(s): Monitored during session;Premedicated before session;Repositioned;Ice applied     Hand Dominance Right   Extremity/Trunk Assessment Upper Extremity Assessment Upper Extremity Assessment: RUE deficits/detail RUE Deficits / Details: ROM elbow/wrist/hand WFL. RUE: Unable to fully assess due to immobilization   Lower Extremity Assessment Lower Extremity Assessment: Overall WFL for tasks assessed   Cervical / Trunk Assessment Cervical / Trunk Assessment: Normal   Communication Communication Communication: No difficulties   Cognition Arousal/Alertness: Awake/alert Behavior During Therapy: WFL for tasks assessed/performed Overall  Cognitive Status: Within Functional Limits for tasks assessed                      General Comments       Exercises Exercises: Shoulder;Other exercises (see flowsheet for exercises performed this session) Other Exercises Other Exercises: RUE lap slides x10   Shoulder Instructions Shoulder Instructions Donning/doffing shirt without moving shoulder: Minimal assistance;Caregiver independent with task;Patient able to independently direct caregiver Method for sponge bathing under operated UE: Minimal assistance;Caregiver independent with task;Patient able to independently direct caregiver Donning/doffing sling/immobilizer: Minimal assistance;Caregiver independent with task;Patient able to independently direct caregiver Correct positioning of sling/immobilizer: Supervision/safety;Caregiver independent with task;Patient able to independently direct caregiver Pendulum exercises (written home exercise program): Supervision/safety;Caregiver independent with task;Patient able to independently direct caregiver ROM for elbow, wrist and digits of operated UE: Supervision/safety;Caregiver independent with task;Patient able to independently direct caregiver Sling wearing schedule (on at all times/off for ADL's): Supervision/safety;Caregiver independent with task;Patient able to independently direct caregiver Proper positioning of operated UE when showering: Supervision/safety;Caregiver independent with task;Patient able to independently direct caregiver Positioning of UE while sleeping: Minimal assistance;Caregiver independent with task;Patient able to independently direct caregiver    Home Living Family/patient expects to be discharged to:: Private residence Living Arrangements: Spouse/significant other Available Help at Discharge: Family;Available 24 hours/day Type of Home: House Home Access: Ramped entrance     Home Layout: One level     Bathroom Shower/Tub: Occupational psychologist: Handicapped height     Home Equipment: Shower seat - built in          Prior  Functioning/Environment Level of Independence: Independent                 OT Problem List: Decreased range of motion;Decreased activity tolerance;Decreased knowledge of use of DME or AE;Decreased knowledge of precautions;Impaired UE functional use;Increased edema;Pain   OT Treatment/Interventions: Self-care/ADL training;Therapeutic exercise;DME and/or AE instruction;Therapeutic activities;Patient/family education;Balance training    OT Goals(Current goals can be found in the care plan section) Acute Rehab OT Goals Patient Stated Goal: return home OT Goal Formulation: With patient/family Time For Goal Achievement: 10/25/16 Potential to Achieve Goals: Good ADL Goals Pt Will Perform Upper Body Bathing: with supervision;sitting Pt Will Perform Upper Body Dressing: with supervision;sitting Additional ADL Goal #1: Pt will don/doff sling with set up as precursor to ADL and functional mobility. Additional ADL Goal #2: Pt will independently perfrom RUE AROM elbow/wrist/hand and lap slides/pendulums.  OT Frequency: Min 2X/week   Barriers to D/C:            Co-evaluation              End of Session Equipment Utilized During Treatment: Other (comment) (sling) Nurse Communication: Mobility status;Other (comment) (pt ready for d/c from OT standpoint)  Activity Tolerance: Patient tolerated treatment well Patient left: in chair;with call bell/phone within reach;with family/visitor present   Time: [id number] OT Time Calculation (min): 21 min Charges:  OT General Charges $OT Visit: 1 Procedure OT Evaluation $OT Eval Moderate Complexity: 1 Procedure G-Codes:     Binnie Kand M.S., OTR/L PagerJN:[id number]  10/11/2016, 11:02 AM

## 2016-10-11 NOTE — Progress Notes (Signed)
Orthopedics Progress Note  Subjective: Stable overnight.  Patient looking forward to going home. Pain controlled  Objective:  Vitals:   10/10/16 2045 10/11/16 0500  BP: (!) 111/56 139/76  Pulse: 85 74  Resp:    Temp: 98.1 F (36.7 C) 98.3 F (36.8 C)    General: Awake and alert  Musculoskeletal: dressing CDI, mod swelling, NVI Neurovascularly intact  Lab Results  Component Value Date   WBC 7.4 09/29/2016   HGB 10.5 (L) 09/29/2016   HCT 31.1 (L) 09/29/2016   MCV 92.0 09/29/2016   PLT 315 09/29/2016       Component Value Date/Time   NA 133 (L) 09/29/2016 0841   K 3.1 (L) 09/29/2016 0841   CL 96 (L) 09/29/2016 0841   CO2 24 09/29/2016 0841   GLUCOSE 97 09/29/2016 0841   BUN 16 09/29/2016 0841   CREATININE 1.01 (H) 09/29/2016 0841   CALCIUM 9.6 09/29/2016 0841   GFRNONAA 59 (L) 09/29/2016 0841   GFRAA >60 09/29/2016 0841    Lab Results  Component Value Date   INR 1.08 04/25/2015    Assessment/Plan: POD #1 s/p Procedure(s): Right shoulder reverse total shoulder arthroplasty D/C home after therapy Bactroban for her nose due to recent contact with a MRSA patient Follow up in two weeks in the office  Remo Lipps R. Veverly Fells, MD 10/11/2016 7:57 AM

## 2016-10-11 NOTE — Op Note (Signed)
NAMEKATERA, ACHEY NO.:  [id number]  MEDICAL RECORD NO.:  [id number]  LOCATION:  5N21C                        FACILITY:  [facility]  PHYSICIAN:  Doran Heater. Veverly Fells, M.D. DATE OF BIRTH:  1956/06/13  DATE OF PROCEDURE:  10/10/2016 DATE OF DISCHARGE:                              OPERATIVE REPORT   PREOPERATIVE DIAGNOSIS:  Right shoulder osteoarthritis and rotator cuff insufficiency.  POSTOPERATIVE DIAGNOSIS:  Right shoulder osteoarthritis and rotator cuff insufficiency.  PROCEDURE PERFORMED:  Right reverse total shoulder arthroplasty using DePuy Delta Xtend prosthesis.  ATTENDING SURGEON:  Doran Heater. Veverly Fells, MD.  ASSISTANT:  Abbott Pao. Dixon, PA-C, who scrubbed the entire procedure and necessary for satisfactory completion of surgery.  ANESTHESIA:  General anesthesia plus interscalene block was used.  ESTIMATED BLOOD LOSS:  150 mL.  FLUID REPLACEMENT:  1200 mL crystalloid.  INSTRUMENT COUNTS:  Correct.  COMPLICATIONS:  There were no complications.  ANTIBIOTICS:  Perioperative antibiotics were given.  INDICATIONS:  The patient is a 61 year old female with worsening right shoulder pain secondary to rotator cuff tear, arthropathy, and progressive arthritis.  The patient presents with loss of fixed fulcrum mechanics and severe bone-on-bone arthritis.  The patient presents for total shoulder arthroplasty, reverse to restore functional neck pain. Informed consent obtained.  DESCRIPTION OF PROCEDURE:  After an adequate level of anesthesia achieved, the patient was positioned in a modified beach-chair position. Right shoulder correctly identified, sterilely prepped and draped in usual manner.  Time-out was called.  We entered the shoulder using standard deltopectoral approach starting at the coracoid process extending down to the anterior humerus.  Dissection down through subcutaneous tissues using Bovie, identified cephalic vein, took it laterally with the  deltoid, pectoralis taken medially.  Conjoint tendon identified and retracted medially.  Deep retractors placed.  We went ahead and tenodesed the biceps in situ with 0 Vicryl figure-of-eight suture x2.  We then released the subscapularis subperiosteally off the lesser tuberosity and tagged for repair at the end.  We did a progressive external rotation and released the inferior capsule off the humerus and then released the biceps at the articular margin and then the remnant of the supraspinatus and infraspinatus, which was largely torn.  We resected that remnant.  At this point, we externally rotated the humerus delivering the humeral head out of the wound.  We then entered the proximal humerus with a 6 mm reamer and reamed it up to a size 10 mm for the intramedullary guide to be placed.  We then resected the head 10 degrees of retroversion using the IM guide at the appropriate height.  Once we had that done, we went ahead and removed excess osteophytes with a rongeur.  We then went ahead and did our metaphyseal reaming with our epi-1 right metaphyseal reamer.  Once we had that prepared, we placed our trial stem 10 stem with an epi-1 right metaphyseal component set on the 0 setting and placed in 10 degrees of retroversion, impacted that in position, and kept that throughout the remainder of the dissection on the glenoid side to protect the bone.  We placed our retractors.  We then did a 360-degree capsulectomy and glenoid labrum removal.  We removed the biceps stump.  At this point, we removed the remaining cartilage on the very anterior aspect of the glenoid.  There was quite a bit of retroversion.  The Cobb elevator was used to remove the cartilage and rongeur.  We then found the center point for our guide pin and then reamed for the metaglene.  Again, eccentric reaming was performed on the anterior side to bring that down level with the posterior side to correct for the retroversion of  heads was required.  Once we had that metaphyseal preparation done with the reamer, then we used a hand reamer peripherally to make sure our glenoid sphere would seat.  We then impacted the real glenoid sphere after removing our guide pin.  We have irrigated prior to that.  With our metaglene base plate in place, we placed a 42 screw inferiorly and a 36 into the base of the coracoid, excellent purchase obtained.  Our anterior screw was too close to the edge and we did not want to potentially crack that, and the posterior screw the same, so we had 2 good screws base plate, nice and stable.  We placed a 38 standard glenoid sphere, impacted that and screwed it into position.  I checked to make sure it was secure and that there was no soft tissue interposed. The axillary nerve was protected during this portion of procedure.  We then went ahead and reduced with a 38+ 3 poly trial and that fit perfectly with nice tension.  No gapping with inferior pole and external rotation and conjoined tendon was nice and tight.  We removed the trial components from the humeral side, drilled holes, and placed sutures for repair of the subscapularis.  These were #2 FiberWire.  We used a HA- coated press-fit stem with the 10 body and epi-1 right set on 0 setting, placed in 10 degrees of retroversion, but we went ahead and used a little bit of vacuum mixed and hand packed cement down in the canal to back up our proximal fixation, which we wanted to the bone to HA-coated prosthesis, so we just made sure there was no cement proximally, and we used a little impaction grafting technique with available bone graft at the top.  We had excellent fit of that stem and we impacted into position, it was quite stable.  We then selected the real 38+ 3 poly, impacted that in position, reduced the shoulder with a nice pop, and again nice and tight with normal excursion and everything moving smoothly and stable.  We irrigated  thoroughly and then repaired the subscapularis anatomically back to the lesser tuberosity and then 0 Vicryl used to close the deltopectoral interval after irrigation and then 2-0 Vicryl subcu closure and 4-0 Monocryl for skin.  Steri-Strips applied followed by sterile dressing.  The patient tolerated the surgery well.     Doran Heater. Veverly Fells, M.D.     SRN/MEDQ  D:  10/10/2016  T:  10/11/2016  Job:  [id number]

## 2016-10-11 NOTE — Discharge Summary (Signed)
Physician Discharge Summary   Patient ID: Felicia Burke MRN: [id number] DOB/AGE: [birth year] 61 y.o.  Admit date: 10/10/2016 Discharge date: 10/11/2016  Admission Diagnoses:  Active Problems:   S/P shoulder replacement, right   Discharge Diagnoses:  Same   Surgeries: Procedure(s): Right shoulder reverse total shoulder arthroplasty on 10/10/2016   Consultants: OT  Discharged Condition: Stable  Hospital Course: Felicia Burke is an 61 y.o. female who was admitted 10/10/2016 with a chief complaint of right shoulder pain, and found to have a diagnosis of right shoulder OA and RC insufficiency.  They were brought to the operating room on 10/10/2016 and underwent the above named procedures.    The patient had an uncomplicated hospital course and was stable for discharge.  Recent vital signs:  Vitals:   10/10/16 2045 10/11/16 0500  BP: (!) 111/56 139/76  Pulse: 85 74  Resp:    Temp: 98.1 F (36.7 C) 98.3 F (36.8 C)    Recent laboratory studies:  Results for orders placed or performed during the hospital encounter of 16/60/63  Basic metabolic panel  Result Value Ref Range   Sodium 133 (L) 135 - 145 mEq/L   Potassium 4.4 3.5 - 5.1 mEq/L   Chloride 98 96 - 112 mEq/L   CO2 30 19 - 32 mEq/L   Glucose, Bld 73 70 - 99 mg/dL   BUN 10 6 - 23 mg/dL   Creatinine, Ser 0.53 0.4 - 1.2 mg/dL   Calcium 9.4 8.4 - 10.5 mg/dL   GFR calc non Af Amer >60 >60 mL/min   GFR calc Af Amer  >60 mL/min    >60        The eGFR has been calculated using the MDRD equation. This calculation has not been validated in all clinical situations. eGFR's persistently <60 mL/min signify possible Chronic Kidney Disease.  Hemoglobin-hemacue, POC  Result Value Ref Range   Hemoglobin 11.2 (L) 12.0 - 15.0 g/dL    Discharge Medications:   Allergies as of 10/11/2016      Reactions   Dilaudid [hydromorphone Hcl] Itching   PO NARCOTICS cause itching   Flagyl [metronidazole] Itching      Medication List      TAKE these medications   amitriptyline 50 MG tablet Commonly known as:  ELAVIL Take 50 mg by mouth at bedtime.   amoxicillin 500 MG tablet Commonly known as:  AMOXIL Take 2,000 mg by mouth See admin instructions. Prior to dental procedures   aspirin 81 MG tablet Take 81 mg by mouth every Monday, Wednesday, and Friday.   buPROPion 300 MG 24 hr tablet Commonly known as:  WELLBUTRIN XL Take 300 mg by mouth daily.   busPIRone 10 MG tablet Commonly known as:  BUSPAR Take 10 mg by mouth 2 (two) times daily.   CALTRATE 600+D 600-400 MG-UNIT chew tablet Generic drug:  Calcium Carbonate-Vitamin D Chew 2 tablets by mouth daily.   diphenhydrAMINE 50 MG tablet Commonly known as:  BENADRYL Take 50 mg by mouth every 8 (eight) hours as needed for itching or sleep.   docusate sodium 100 MG capsule Commonly known as:  COLACE Take 100 mg by mouth 2 (two) times daily as needed (for constipation).   esomeprazole 20 MG capsule Commonly known as:  NEXIUM Take 20 mg by mouth daily.   gabapentin 300 MG capsule Commonly known as:  NEURONTIN Take 300-600 mg by mouth See admin instructions. 300 mg twice daily. 600 mg at bedtime   hydrochlorothiazide 25 MG tablet  Physician Discharge Summary   Patient ID: Felicia Burke MRN: [id number] DOB/AGE: 10/09/1955 60 y.o.  Admit date: 10/10/2016 Discharge date: 10/11/2016  Admission Diagnoses:  Active Problems:   S/P shoulder replacement, right   Discharge Diagnoses:  Same   Surgeries: Procedure(s): Right shoulder reverse total shoulder arthroplasty on 10/10/2016   Consultants: OT  Discharged Condition: Stable  Hospital Course: Felicia Burke is an 60 y.o. female who was admitted 10/10/2016 with a chief complaint of right shoulder pain, and found to have a diagnosis of right shoulder OA and RC insufficiency.  They were brought to the operating room on 10/10/2016 and underwent the above named procedures.    The patient had an uncomplicated hospital course and was stable for discharge.  Recent vital signs:  Vitals:   10/10/16 2045 10/11/16 0500  BP: (!) 111/56 139/76  Pulse: 85 74  Resp:    Temp: 98.1 F (36.7 C) 98.3 F (36.8 C)    Recent laboratory studies:  Results for orders placed or performed during the hospital encounter of 05/02/10  Basic metabolic panel  Result Value Ref Range   Sodium 133 (L) 135 - 145 mEq/L   Potassium 4.4 3.5 - 5.1 mEq/L   Chloride 98 96 - 112 mEq/L   CO2 30 19 - 32 mEq/L   Glucose, Bld 73 70 - 99 mg/dL   BUN 10 6 - 23 mg/dL   Creatinine, Ser 0.53 0.4 - 1.2 mg/dL   Calcium 9.4 8.4 - 10.5 mg/dL   GFR calc non Af Amer >60 >60 mL/min   GFR calc Af Amer  >60 mL/min    >60        The eGFR has been calculated using the MDRD equation. This calculation has not been validated in all clinical situations. eGFR's persistently <60 mL/min signify possible Chronic Kidney Disease.  Hemoglobin-hemacue, POC  Result Value Ref Range   Hemoglobin 11.2 (L) 12.0 - 15.0 g/dL    Discharge Medications:   Allergies as of 10/11/2016      Reactions   Dilaudid [hydromorphone Hcl] Itching   PO NARCOTICS cause itching   Flagyl [metronidazole] Itching      Medication List      TAKE these medications   amitriptyline 50 MG tablet Commonly known as:  ELAVIL Take 50 mg by mouth at bedtime.   amoxicillin 500 MG tablet Commonly known as:  AMOXIL Take 2,000 mg by mouth See admin instructions. Prior to dental procedures   aspirin 81 MG tablet Take 81 mg by mouth every Monday, Wednesday, and Friday.   buPROPion 300 MG 24 hr tablet Commonly known as:  WELLBUTRIN XL Take 300 mg by mouth daily.   busPIRone 10 MG tablet Commonly known as:  BUSPAR Take 10 mg by mouth 2 (two) times daily.   CALTRATE 600+D 600-400 MG-UNIT chew tablet Generic drug:  Calcium Carbonate-Vitamin D Chew 2 tablets by mouth daily.   diphenhydrAMINE 50 MG tablet Commonly known as:  BENADRYL Take 50 mg by mouth every 8 (eight) hours as needed for itching or sleep.   docusate sodium 100 MG capsule Commonly known as:  COLACE Take 100 mg by mouth 2 (two) times daily as needed (for constipation).   esomeprazole 20 MG capsule Commonly known as:  NEXIUM Take 20 mg by mouth daily.   gabapentin 300 MG capsule Commonly known as:  NEURONTIN Take 300-600 mg by mouth See admin instructions. 300 mg twice daily. 600 mg at bedtime   hydrochlorothiazide 25 MG tablet   Physician Discharge Summary   Patient ID: Felicia Burke MRN: [id number] DOB/AGE: 10/09/1955 60 y.o.  Admit date: 10/10/2016 Discharge date: 10/11/2016  Admission Diagnoses:  Active Problems:   S/P shoulder replacement, right   Discharge Diagnoses:  Same   Surgeries: Procedure(s): Right shoulder reverse total shoulder arthroplasty on 10/10/2016   Consultants: OT  Discharged Condition: Stable  Hospital Course: Felicia Burke is an 60 y.o. female who was admitted 10/10/2016 with a chief complaint of right shoulder pain, and found to have a diagnosis of right shoulder OA and RC insufficiency.  They were brought to the operating room on 10/10/2016 and underwent the above named procedures.    The patient had an uncomplicated hospital course and was stable for discharge.  Recent vital signs:  Vitals:   10/10/16 2045 10/11/16 0500  BP: (!) 111/56 139/76  Pulse: 85 74  Resp:    Temp: 98.1 F (36.7 C) 98.3 F (36.8 C)    Recent laboratory studies:  Results for orders placed or performed during the hospital encounter of 05/02/10  Basic metabolic panel  Result Value Ref Range   Sodium 133 (L) 135 - 145 mEq/L   Potassium 4.4 3.5 - 5.1 mEq/L   Chloride 98 96 - 112 mEq/L   CO2 30 19 - 32 mEq/L   Glucose, Bld 73 70 - 99 mg/dL   BUN 10 6 - 23 mg/dL   Creatinine, Ser 0.53 0.4 - 1.2 mg/dL   Calcium 9.4 8.4 - 10.5 mg/dL   GFR calc non Af Amer >60 >60 mL/min   GFR calc Af Amer  >60 mL/min    >60        The eGFR has been calculated using the MDRD equation. This calculation has not been validated in all clinical situations. eGFR's persistently <60 mL/min signify possible Chronic Kidney Disease.  Hemoglobin-hemacue, POC  Result Value Ref Range   Hemoglobin 11.2 (L) 12.0 - 15.0 g/dL    Discharge Medications:   Allergies as of 10/11/2016      Reactions   Dilaudid [hydromorphone Hcl] Itching   PO NARCOTICS cause itching   Flagyl [metronidazole] Itching      Medication List      TAKE these medications   amitriptyline 50 MG tablet Commonly known as:  ELAVIL Take 50 mg by mouth at bedtime.   amoxicillin 500 MG tablet Commonly known as:  AMOXIL Take 2,000 mg by mouth See admin instructions. Prior to dental procedures   aspirin 81 MG tablet Take 81 mg by mouth every Monday, Wednesday, and Friday.   buPROPion 300 MG 24 hr tablet Commonly known as:  WELLBUTRIN XL Take 300 mg by mouth daily.   busPIRone 10 MG tablet Commonly known as:  BUSPAR Take 10 mg by mouth 2 (two) times daily.   CALTRATE 600+D 600-400 MG-UNIT chew tablet Generic drug:  Calcium Carbonate-Vitamin D Chew 2 tablets by mouth daily.   diphenhydrAMINE 50 MG tablet Commonly known as:  BENADRYL Take 50 mg by mouth every 8 (eight) hours as needed for itching or sleep.   docusate sodium 100 MG capsule Commonly known as:  COLACE Take 100 mg by mouth 2 (two) times daily as needed (for constipation).   esomeprazole 20 MG capsule Commonly known as:  NEXIUM Take 20 mg by mouth daily.   gabapentin 300 MG capsule Commonly known as:  NEURONTIN Take 300-600 mg by mouth See admin instructions. 300 mg twice daily. 600 mg at bedtime   hydrochlorothiazide 25 MG tablet 

## 2016-10-11 NOTE — Progress Notes (Signed)
Reviewed all discharge instructions with patient and she stated understanding.  No voiced complaints.  Paper prescriptions given.  Patient confirmed she is taking home glasses and clothes, which is what she brought to hospital.

## 2016-10-13 ENCOUNTER — Other Ambulatory Visit: Payer: Self-pay | Admitting: *Deleted

## 2016-10-13 ENCOUNTER — Encounter (HOSPITAL_COMMUNITY): Payer: Self-pay | Admitting: Orthopedic Surgery

## 2016-10-13 NOTE — Anesthesia Postprocedure Evaluation (Addendum)
Anesthesia Post Note  Patient: Felicia Burke  Procedure(s) Performed: Procedure(s) (LRB): Right shoulder reverse total shoulder arthroplasty (Right)  Patient location during evaluation: PACU Anesthesia Type: General and Regional Level of consciousness: awake and alert Pain management: pain level controlled Vital Signs Assessment: post-procedure vital signs reviewed and stable Respiratory status: spontaneous breathing, nonlabored ventilation, respiratory function stable and patient connected to nasal cannula oxygen Cardiovascular status: blood pressure returned to baseline and stable Postop Assessment: no signs of nausea or vomiting Anesthetic complications: no       Last Vitals:  Vitals:   10/10/16 2045 10/11/16 0500  BP: (!) 111/56 139/76  Pulse: 85 74  Resp:    Temp: 36.7 C 36.8 C    Last Pain:  Vitals:   10/11/16 0900  TempSrc:   PainSc: 6                  Jansen Goodpasture,JAMES TERRILL

## 2016-10-13 NOTE — Patient Outreach (Signed)
[facility]) Care Management  10/13/2016  Felicia Burke September 22, 1955 [id number]   Subjective: Telephone call to patient's home number, no answer, left HIPAA compliant voicemail message, and requested call back.    Objective: Per chart review: Patient hospitalized  10/10/16 - 10/11/16 for Right shoulder osteoarthritis.   Status post Right shoulder reverse total shoulder arthroplasty (Right) DePuy Delta Xtend on 10/10/16.     Assessment: Received [facility]MR Transition of care referral on 10/13/16 via [employer] report.  Transition of care follow up pending patient contact.  [facility] Care Management RNCM preop call completed on 10/06/16.     Plan: RNCM will call patient for 2nd telephone outreach attempt, transition of care follow up, within 10 business days, if no return call.   Robertson Colclough H. Annia Friendly, BSN, [place] Management [facility] Telephonic CM Phone: [phone] Fax: [phone]

## 2016-10-14 ENCOUNTER — Other Ambulatory Visit: Payer: Self-pay | Admitting: *Deleted

## 2016-10-14 NOTE — Patient Outreach (Signed)
[facility]) Care Management  10/14/2016  Felicia Burke Jan 17, 1956 [id number]   Subjective: Telephone call to patient's home number, no answer, left HIPAA compliant voicemail message, and requested call back.    Objective: Per chart review: Patient hospitalized  10/10/16 - 10/11/16 for Right shoulder osteoarthritis.   Status post Right shoulder reverse total shoulder arthroplasty (Right)DePuy Delta Xtend on 10/10/16.     Assessment: Received UMR Transition of care referral on 10/13/16 via [employer] report.  Transition of care follow up pending patient contact.  [facility] Care Management RNCM preop call completed on 10/06/16.     Plan: RNCM will call patient for 3rd telephone outreach attempt, transition of care follow up, within 10 business days, if no return call.   Brittony Billick H. Annia Friendly, BSN, [place] Management [facility] Telephonic CM Phone: [phone] Fax: [phone]

## 2016-10-16 ENCOUNTER — Encounter: Payer: Self-pay | Admitting: *Deleted

## 2016-10-16 ENCOUNTER — Other Ambulatory Visit: Payer: Self-pay | Admitting: *Deleted

## 2016-10-16 NOTE — Patient Outreach (Addendum)
Felicia Burke [facility]) [facility] Management  10/16/2016  Felicia Burke Apr 22, 1956 [id number]   Subjective: Telephone call to patient's home number, no answer, message states voicemail not set up, and unable to leave a message. Telephone call to patient's mobile number, no answer, left HIPAA compliant voicemail message, and requested call back.   Objective: Per chart review: Patient hospitalized 10/10/16 - 10/11/16 for Right shoulder osteoarthritis. Status post Right shoulder reverse total shoulder arthroplasty (Right)DePuy Delta Xtend on 10/10/16.   Assessment: Received [facility]MR Transition of Burke referral on 10/13/16 via [employer] report. Transition of Burke follow up pending patient contact. [facility] Management RNCM preop call completed on 10/06/16.    Plan:RNCM will send case status update request to Felicia Burke at [facility].  RNCM will send patient unsuccessful outreach letter, [facility] pamphlet, and proceed with case closure within 10 business days, if no return call.   Felicia Burke H. Annia Friendly, BSN, [place] Management [facility] Telephonic CM Phone: [phone] Fax: [phone]

## 2016-10-17 MED FILL — traMADol HCL 50 MG TABS: 50 | 30 days supply | Qty: 180 | Fill #1

## 2016-10-20 ENCOUNTER — Other Ambulatory Visit: Payer: Self-pay | Admitting: *Deleted

## 2016-10-20 MED FILL — TEMAZEPAM 30 MG CAPSULE: 30 | 30 days supply | Qty: 30 | Fill #2

## 2016-10-20 MED FILL — MIRTAZAPINE 15 MG ODT: 15 | 90 days supply | Qty: 90 | Fill #3

## 2016-10-20 MED FILL — AMOXICILLIN 500 MG CAPSULE: 500 | 3 days supply | Qty: 12 | Fill #12

## 2016-10-20 NOTE — Patient Outreach (Signed)
[facility]) Care Management  10/20/2016  Felicia Burke 08/10/1956 [id number]  Subjective: Received voicemail message from patient, states she is returning call, and requested call back if needed.  States she is doing well, had surgery on 10/10/16, and has no questions at this time.  Telephone call to patient's home number, no answer, left HIPAA compliant voicemail message, and requested call back.   Objective: Per chart review: Patient hospitalized 10/10/16 - 10/11/16 for Right shoulder osteoarthritis. Status post Right shoulder reverse total shoulder arthroplasty (Right)DePuy Delta Xtend on 10/10/16.   Assessment: Received UMR Transition of care referral on 10/13/16 via [employer] report. Transition of care follow up pending patient contact. [facility] Care Management RNCM preop call completed on 10/06/16.    Plan:RNCM will proceed with case closure within 10 business days ( 10/30/16) from date of unsuccessful outreach letter sent on 10/16/16, if no return call.   Carlson Belland H. Annia Friendly, BSN, [place] Management [facility] Telephonic CM Phone: [phone] Fax: [phone]

## 2016-10-23 DIAGNOSIS — M19011 Primary osteoarthritis, right shoulder: Secondary | ICD-10-CM | POA: Diagnosis not present

## 2016-10-23 DIAGNOSIS — Z96611 Presence of right artificial shoulder joint: Secondary | ICD-10-CM | POA: Diagnosis not present

## 2016-10-23 DIAGNOSIS — Z471 Aftercare following joint replacement surgery: Secondary | ICD-10-CM | POA: Diagnosis not present

## 2016-10-24 ENCOUNTER — Other Ambulatory Visit: Payer: Self-pay | Admitting: *Deleted

## 2016-10-24 NOTE — Patient Outreach (Signed)
[facility]) Care Management  10/24/2016  Felicia Burke 01-Oct-1955 [id number]   Subjective:  Received voicemail message from patient, stated name and date of birth.  Patient apologized for being unable to reach due to staying with her mother to provide supervision.   States she is doing well, staying with mother, has a follow up appointment with primary MD in March 2018, ortho MD follow up on 10/23/16, medications have been filled through [place], and she has received [place] Management information, and does not have any needs at this time.  States she is very appreciative of the information, follow up calls, does not need a call back at this time, and will reach out to this RNCM if any questions arise in the future.    Objective: Per chart review: Patient hospitalized 10/10/16 - 10/11/16 for Right shoulder osteoarthritis. Status post Right shoulder reverse total shoulder arthroplasty (Right)DePuy Delta Xtend on 10/10/16. [facility] RNCM preop call completed on 10/06/16.    Assessment: Received [facility] Transition of care referral on 10/13/16 via [employer] report. Transition of care follow up update received from patient, no care management needs, and will proceed with case closure .    Plan:RNCM will send case closure due to follow up completed / no care management needs request to [facility] [place] [facility].   Whitman Meinhardt H. Annia Friendly, BSN, [place] Management [facility] Telephonic CM Phone: [phone] Fax: [phone]

## 2016-11-10 MED FILL — AMOXICILLIN 500 MG CAPSULE: 500 | 3 days supply | Qty: 12 | Fill #13

## 2016-11-10 MED FILL — PANTOPRAZOLE SOD DR 40 MG T: 40 | 90 days supply | Qty: 180 | Fill #1

## 2016-11-11 ENCOUNTER — Other Ambulatory Visit: Payer: 59

## 2016-11-13 MED FILL — OXYCODONE/APAP 5/325 MG TAB: 5-325 | 15 days supply | Qty: 60 | Fill #0

## 2016-11-17 ENCOUNTER — Ambulatory Visit
Admission: RE | Admit: 2016-11-17 | Discharge: 2016-11-17 | Disposition: A | Discharge status: Home / Self Care | Payer: 59 | Source: Ambulatory Visit | Attending: Obstetrics and Gynecology | Admitting: Obstetrics and Gynecology

## 2016-11-17 DIAGNOSIS — M8580 Other specified disorders of bone density and structure, unspecified site: Secondary | ICD-10-CM

## 2016-11-17 DIAGNOSIS — Z780 Asymptomatic menopausal state: Secondary | ICD-10-CM | POA: Diagnosis not present

## 2016-11-17 DIAGNOSIS — M8589 Other specified disorders of bone density and structure, multiple sites: Secondary | ICD-10-CM | POA: Diagnosis not present

## 2016-11-20 ENCOUNTER — Other Ambulatory Visit: Payer: 59

## 2016-11-21 DIAGNOSIS — F331 Major depressive disorder, recurrent, moderate: Secondary | ICD-10-CM | POA: Diagnosis not present

## 2016-11-21 DIAGNOSIS — Z96619 Presence of unspecified artificial shoulder joint: Secondary | ICD-10-CM | POA: Diagnosis not present

## 2016-11-21 DIAGNOSIS — Z1389 Encounter for screening for other disorder: Secondary | ICD-10-CM | POA: Diagnosis not present

## 2016-11-21 MED FILL — SERTRALINE HCL 100 MG TAB: 100 | 90 days supply | Qty: 90 | Fill #1

## 2016-11-21 MED FILL — MIRTAZAPINE 30 MG ODT: 30 | 30 days supply | Qty: 30 | Fill #0

## 2016-11-21 MED FILL — traMADol HCL 50 MG TABS: 50 | 30 days supply | Qty: 180 | Fill #2

## 2016-11-25 DIAGNOSIS — Z471 Aftercare following joint replacement surgery: Secondary | ICD-10-CM | POA: Diagnosis not present

## 2016-11-25 DIAGNOSIS — Z96611 Presence of right artificial shoulder joint: Secondary | ICD-10-CM | POA: Diagnosis not present

## 2016-11-25 MED FILL — TEMAZEPAM 30 MG CAPSULE: 30 | 30 days supply | Qty: 30 | Fill #0

## 2016-11-25 MED FILL — METHOCARBAMOL 500 MG TABLET: 500 | 15 days supply | Qty: 60 | Fill #0

## 2016-12-02 DIAGNOSIS — M8580 Other specified disorders of bone density and structure, unspecified site: Secondary | ICD-10-CM | POA: Diagnosis not present

## 2016-12-02 DIAGNOSIS — M810 Age-related osteoporosis without current pathological fracture: Secondary | ICD-10-CM | POA: Diagnosis not present

## 2016-12-08 MED FILL — POTASSIUM CL ER 10 MEQ TAB: 10 | 84 days supply | Qty: 36 | Fill #2

## 2016-12-08 MED FILL — busPIRone HCL 10 MG TABS: 10 | 90 days supply | Qty: 180 | Fill #1

## 2016-12-10 ENCOUNTER — Other Ambulatory Visit (HOSPITAL_COMMUNITY): Payer: Self-pay | Admitting: *Deleted

## 2016-12-11 ENCOUNTER — Ambulatory Visit (HOSPITAL_COMMUNITY)
Admission: RE | Admit: 2016-12-11 | Discharge: 2016-12-11 | Disposition: A | Discharge status: Home / Self Care | Payer: 59 | Source: Ambulatory Visit | Attending: Obstetrics and Gynecology | Admitting: Obstetrics and Gynecology

## 2016-12-11 DIAGNOSIS — M810 Age-related osteoporosis without current pathological fracture: Secondary | ICD-10-CM | POA: Diagnosis not present

## 2016-12-11 DIAGNOSIS — K219 Gastro-esophageal reflux disease without esophagitis: Secondary | ICD-10-CM | POA: Insufficient documentation

## 2016-12-11 MED ORDER — ZOLEDRONIC ACID 5 MG/100ML IV SOLN
5.0000 mg | Freq: Once | INTRAVENOUS | Status: AC
  Administered 2016-12-11: 5 mg via INTRAVENOUS

## 2016-12-11 MED ORDER — ZOLEDRONIC ACID 5 MG/100ML IV SOLN
INTRAVENOUS | Status: AC
  Administered 2016-12-11: 5 mg via INTRAVENOUS
  Filled 2016-12-11: qty 100

## 2016-12-15 DIAGNOSIS — Z96619 Presence of unspecified artificial shoulder joint: Secondary | ICD-10-CM | POA: Diagnosis not present

## 2016-12-15 DIAGNOSIS — F331 Major depressive disorder, recurrent, moderate: Secondary | ICD-10-CM | POA: Diagnosis not present

## 2016-12-15 DIAGNOSIS — Z1389 Encounter for screening for other disorder: Secondary | ICD-10-CM | POA: Diagnosis not present

## 2016-12-15 DIAGNOSIS — Z23 Encounter for immunization: Secondary | ICD-10-CM | POA: Diagnosis not present

## 2016-12-15 DIAGNOSIS — G894 Chronic pain syndrome: Secondary | ICD-10-CM | POA: Diagnosis not present

## 2016-12-15 DIAGNOSIS — I10 Essential (primary) hypertension: Secondary | ICD-10-CM | POA: Diagnosis not present

## 2017-02-06 NOTE — Addendum Note (Signed)
Addendum  created 02/06/17 1029 by Rica Koyanagi, MD   Sign clinical note

## 2018-06-15 IMAGING — DX DG SHOULDER 2+V PORT*R*
2 series · 2 of 2 positions shown · non-contrast
Comparison: None.

CLINICAL DATA: Postop evaluation for right shoulder replacement.

EXAM:
PORTABLE RIGHT SHOULDER

[shoulder y-view]
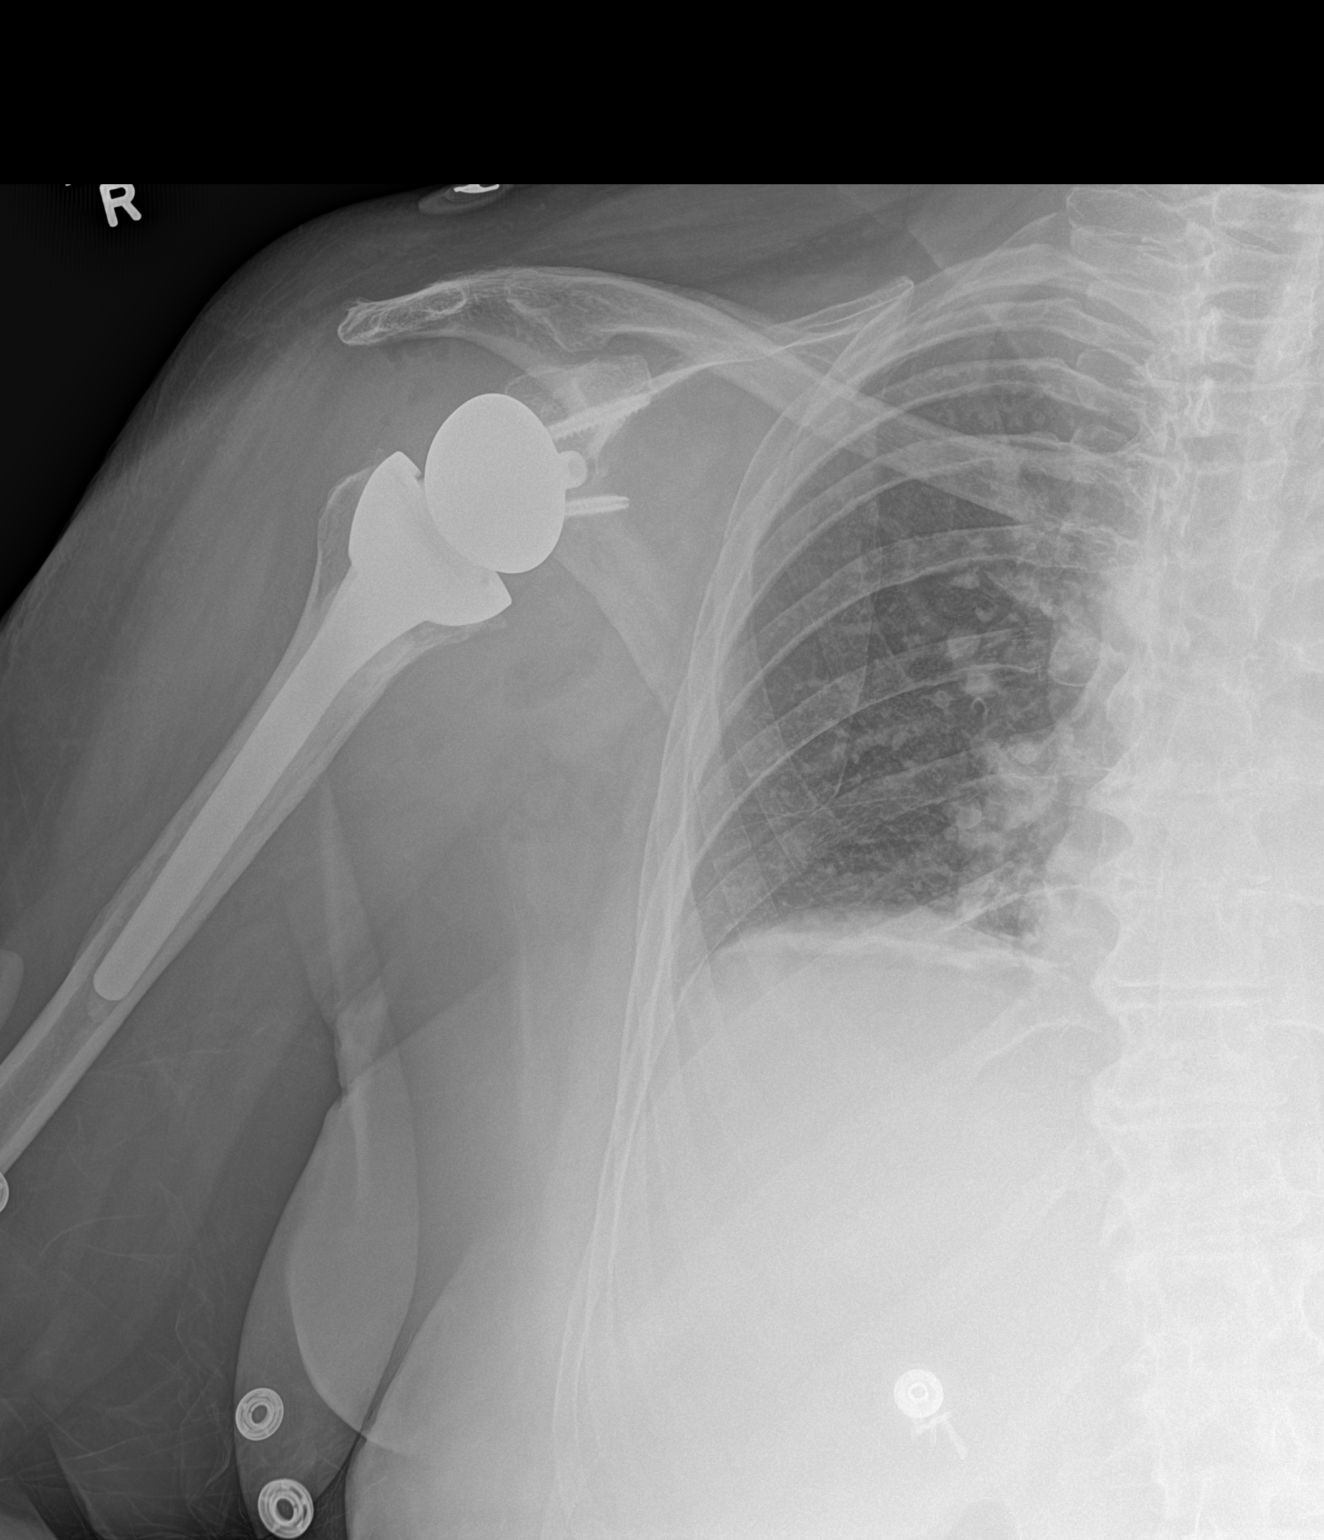

[shoulder axial]
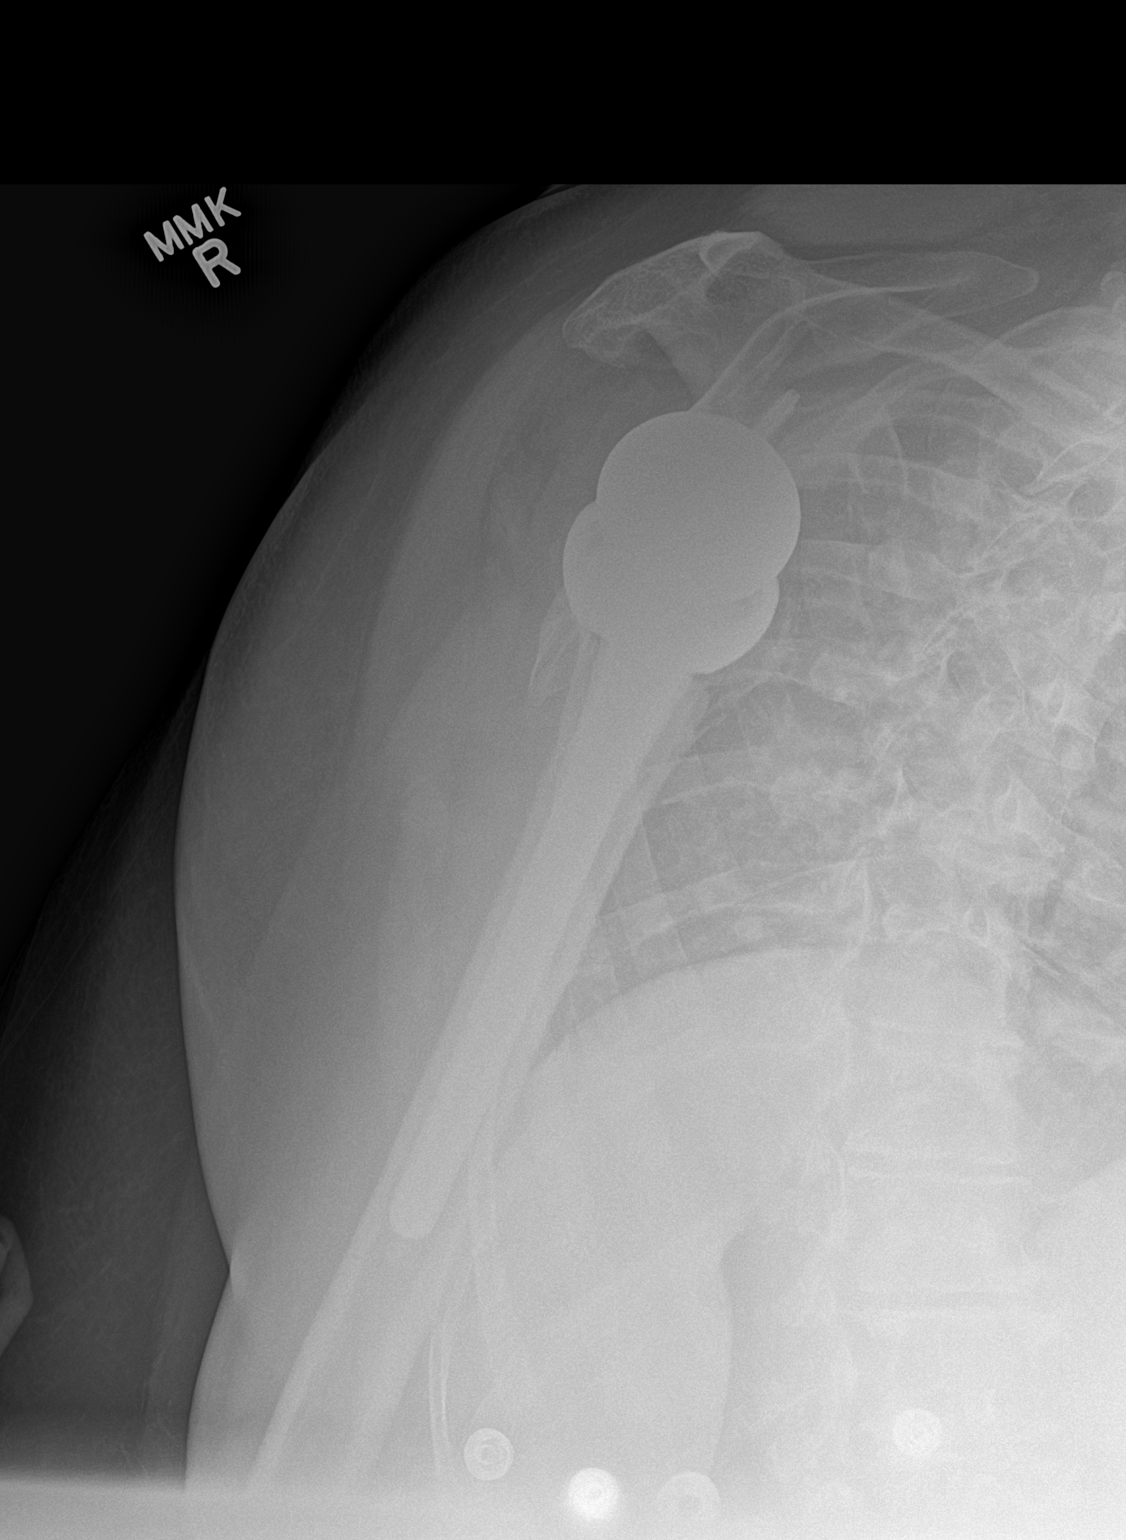

[2 of 2 positions shown; findings below may reference images not displayed]

FINDINGS: A right reverse total shoulder arthroplasty is in place. Hardware
appears well positioned without malalignment. No periprosthetic
fracture or other complication. AC joint approximated. Mild
postoperative soft tissue swelling and emphysema noted about the
right shoulder.

Right basilar atelectasis noted.
IMPRESSION: Interval placement of right reverse total shoulder arthroplasty
without complication.

## 2019-04-29 ENCOUNTER — Other Ambulatory Visit: Payer: Self-pay | Admitting: Family Medicine

## 2019-04-29 DIAGNOSIS — R5381 Other malaise: Secondary | ICD-10-CM

## 2019-05-05 ENCOUNTER — Other Ambulatory Visit: Payer: Self-pay | Admitting: Family Medicine

## 2019-05-05 DIAGNOSIS — M810 Age-related osteoporosis without current pathological fracture: Secondary | ICD-10-CM

## 2019-06-27 ENCOUNTER — Ambulatory Visit
Admission: RE | Admit: 2019-06-27 | Discharge: 2019-06-27 | Disposition: A | Discharge status: Home / Self Care | Payer: BLUE CROSS/BLUE SHIELD | Source: Ambulatory Visit | Attending: Family Medicine | Admitting: Family Medicine

## 2019-06-27 ENCOUNTER — Other Ambulatory Visit: Payer: Self-pay

## 2019-06-27 DIAGNOSIS — M810 Age-related osteoporosis without current pathological fracture: Secondary | ICD-10-CM
